# Patient Record
Sex: Female | Born: 1980 | Race: Black or African American | Hispanic: No | Marital: Single | Smoking: Current some day smoker
Health system: Southern US, Community
[De-identification: ages and names within clinical notes are randomized; demographics above are authoritative.]

## PROBLEM LIST (undated history)

## (undated) DIAGNOSIS — W3400XA Accidental discharge from unspecified firearms or gun, initial encounter: Secondary | ICD-10-CM

## (undated) DIAGNOSIS — R87619 Unspecified abnormal cytological findings in specimens from cervix uteri: Secondary | ICD-10-CM

## (undated) HISTORY — DX: Accidental discharge from unspecified firearms or gun, initial encounter: W34.00XA

## (undated) HISTORY — DX: Unspecified abnormal cytological findings in specimens from cervix uteri: R87.619

---

## 2000-06-14 DIAGNOSIS — A599 Trichomoniasis, unspecified: Secondary | ICD-10-CM

## 2000-06-14 HISTORY — DX: Trichomoniasis, unspecified: A59.9

## 2003-06-15 DIAGNOSIS — W3400XA Accidental discharge from unspecified firearms or gun, initial encounter: Secondary | ICD-10-CM

## 2003-06-15 HISTORY — DX: Accidental discharge from unspecified firearms or gun, initial encounter: W34.00XA

## 2003-06-15 HISTORY — PX: OTHER SURGICAL HISTORY: SHX169

## 2011-06-15 DIAGNOSIS — R87619 Unspecified abnormal cytological findings in specimens from cervix uteri: Secondary | ICD-10-CM

## 2011-06-15 HISTORY — DX: Unspecified abnormal cytological findings in specimens from cervix uteri: R87.619

## 2011-11-16 HISTORY — PX: CERVICAL BIOPSY  W/ LOOP ELECTRODE EXCISION: SUR135

## 2011-11-17 ENCOUNTER — Encounter: Payer: Self-pay | Admitting: Obstetrics and Gynecology

## 2017-07-28 ENCOUNTER — Emergency Department (HOSPITAL_COMMUNITY): Admission: EM | Admit: 2017-07-28 | Discharge: 2017-07-28 | Discharge status: Against Medical Advice | Payer: Self-pay

## 2017-07-28 NOTE — ED Triage Notes (Signed)
Pt gave her stickers to registration and left before triage.

## 2017-11-09 ENCOUNTER — Encounter: Payer: Self-pay | Admitting: General Practice

## 2017-11-17 ENCOUNTER — Encounter: Payer: Self-pay | Admitting: Obstetrics and Gynecology

## 2017-11-17 ENCOUNTER — Telehealth: Payer: Self-pay | Admitting: General Practice

## 2017-11-17 ENCOUNTER — Encounter: Payer: Self-pay | Admitting: General Practice

## 2017-11-17 NOTE — Telephone Encounter (Signed)
Charity application given to patient. 

## 2018-06-11 ENCOUNTER — Encounter (HOSPITAL_COMMUNITY): Payer: Self-pay | Admitting: *Deleted

## 2018-06-11 ENCOUNTER — Other Ambulatory Visit: Payer: Self-pay

## 2018-06-11 ENCOUNTER — Ambulatory Visit (HOSPITAL_COMMUNITY)
Admission: EM | Admit: 2018-06-11 | Discharge: 2018-06-11 | Disposition: A | Discharge status: Home / Self Care | Payer: Self-pay | Attending: Family Medicine | Admitting: Family Medicine

## 2018-06-11 DIAGNOSIS — J111 Influenza due to unidentified influenza virus with other respiratory manifestations: Secondary | ICD-10-CM | POA: Insufficient documentation

## 2018-06-11 MED ORDER — OSELTAMIVIR PHOSPHATE 75 MG PO CAPS: 75.0000 mg | ORAL_CAPSULE | Freq: Two times a day (BID) | ORAL | 0 refills | Status: DC

## 2018-06-11 MED ORDER — HYDROCODONE-HOMATROPINE 5-1.5 MG/5ML PO SYRP: 5.0000 mL | ORAL_SOLUTION | Freq: Four times a day (QID) | ORAL | 0 refills | Status: DC | PRN

## 2018-06-11 MED ORDER — BACITRACIN ZINC 500 UNIT/GM EX OINT
TOPICAL_OINTMENT | CUTANEOUS | Status: AC
  Filled 2018-06-11: qty 0.9

## 2018-06-11 NOTE — ED Provider Notes (Signed)
[facility]    CSN: [id number] Arrival date & time: 06/11/18  1502     History   Chief Complaint Chief Complaint  Patient presents with  . Generalized Body Aches  . Headache  . Cough    HPI Lindsey [name] is a 37 y.o. female.   This is a 37 year old woman comes in complaining of flulike symptoms: Myalgia, perceived fever, and cough.  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  Patient complains about headache and dry cough with occasional yellow phlegm.  She has myalgias and feels weak.  She has had no vomiting.  Patient works in a call center     History reviewed. No pertinent past medical history.  There are no active problems to display for this patient.   History reviewed. No pertinent surgical history.  OB History   No obstetric history on file.      Home Medications    Prior to Admission medications   Medication Sig Start Date End Date Taking? Authorizing Provider  HYDROcodone-homatropine (HYDROMET) 5-1.5 MG/5ML syrup Take 5 mLs by mouth every 6 (six) hours as needed for cough. 06/11/18   Elvina SidleLauenstein, Samanthamarie Ezzell, MD  oseltamivir (TAMIFLU) 75 MG capsule Take 1 capsule (75 mg total) by mouth every 12 (twelve) hours. 06/11/18   Elvina SidleLauenstein, Abdinasir Spadafore, MD    Family History History reviewed. No pertinent family history.  Social History Social History   Tobacco Use  . Smoking status: Current Every Day Smoker  . Smokeless tobacco: Never Used  Substance Use Topics  . Alcohol use: Not Currently  . Drug use: Not on file     Allergies   Patient has no known allergies.   Review of Systems Review of Systems   Physical Exam Triage Vital Signs ED Triage Vitals [06/11/18 1619]  Enc Vitals Group     BP 120/73     Pulse Rate 81     Resp 18     Temp 99 F (37.2 C)     Temp Source Oral     SpO2 100 %     Weight      Height      Head Circumference      Peak Flow      Pain Score 10     Pain Loc      Pain Edu?     Excl. in GC?    No data found.  Updated Vital Signs BP 120/73   Pulse 81   Temp 99 F (37.2 C) (Oral)   Resp 18   LMP 05/19/2018 (Approximate)   SpO2 100%    Physical Exam Vitals signs and nursing note reviewed.  Constitutional:      Appearance: She is well-developed.  HENT:     Head: Normocephalic.     Mouth/Throat:     Mouth: Mucous membranes are moist.  Eyes:     Extraocular Movements: Extraocular movements intact.  Neck:     Musculoskeletal: Normal range of motion and neck supple.  Cardiovascular:     Heart sounds: Normal heart sounds.  Pulmonary:     Effort: Pulmonary effort is normal.     Breath sounds: Normal breath sounds.  Musculoskeletal: Normal range of motion.  Skin:    General: Skin is warm and dry.  Neurological:     Mental Status: She is alert.     Motor: Weakness present.  Psychiatric:        Mood and Affect: Mood normal.  Speech: Speech normal.      UC Treatments / Results  Labs (all labs ordered are listed, but only abnormal results are displayed) Labs Reviewed - No data to display  EKG None  Radiology No results found.  Procedures Procedures (including critical care time)  Medications Ordered in UC Medications - No data to display  Initial Impression / Assessment and Plan / UC Course  I have reviewed the triage vital signs and the nursing notes.  Pertinent labs & imaging results that were available during my care of the patient were reviewed by me and considered in my medical decision making (see chart for details).    Final Clinical Impressions(s) / UC Diagnoses   Final diagnoses:  Influenza   Discharge Instructions   None    ED Prescriptions    Medication Sig Dispense Auth. Provider   oseltamivir (TAMIFLU) 75 MG capsule Take 1 capsule (75 mg total) by mouth every 12 (twelve) hours. 10 capsule Elvina SidleLauenstein, Ikaika Showers, MD   HYDROcodone-homatropine (HYDROMET) 5-1.5 MG/5ML syrup Take 5 mLs by mouth every 6 (six) hours as  needed for cough. 60 mL Elvina SidleLauenstein, Jaylina Ramdass, MD     Controlled Substance Prescriptions [place] Controlled Substance Registry consulted? Not Applicable   Elvina SidleLauenstein, Reghan Thul, MD 06/11/18 [phone]

## 2018-06-11 NOTE — ED Triage Notes (Signed)
C/O body aches, tactile fever, right-sided HA x couple days.

## 2018-06-21 ENCOUNTER — Encounter: Payer: Self-pay | Admitting: Obstetrics and Gynecology

## 2018-07-07 ENCOUNTER — Encounter: Payer: Self-pay | Admitting: Obstetrics and Gynecology

## 2018-07-07 ENCOUNTER — Ambulatory Visit (INDEPENDENT_AMBULATORY_CARE_PROVIDER_SITE_OTHER): Payer: 59 | Admitting: Obstetrics and Gynecology

## 2018-07-07 ENCOUNTER — Other Ambulatory Visit: Payer: Self-pay

## 2018-07-07 ENCOUNTER — Other Ambulatory Visit (HOSPITAL_COMMUNITY)
Admission: RE | Admit: 2018-07-07 | Discharge: 2018-07-07 | Disposition: A | Discharge status: Home / Self Care | Payer: Self-pay | Source: Ambulatory Visit | Attending: Obstetrics and Gynecology | Admitting: Obstetrics and Gynecology

## 2018-07-07 VITALS — BP 110/70 | HR 84 | Resp 18 | Ht 67.0 in | Wt 182.0 lb

## 2018-07-07 DIAGNOSIS — Z01419 Encounter for gynecological examination (general) (routine) without abnormal findings: Secondary | ICD-10-CM | POA: Insufficient documentation

## 2018-07-07 NOTE — Patient Instructions (Signed)
EXERCISE AND DIET:  We recommended that you start or continue a regular exercise program for good health. Regular exercise means any activity that makes your heart beat faster and makes you sweat.  We recommend exercising at least 30 minutes per day at least 3 days a week, preferably 4 or 5.  We also recommend a diet low in fat and sugar.  Inactivity, poor dietary choices and obesity can cause diabetes, heart attack, stroke, and kidney damage, among others.    ALCOHOL AND SMOKING:  Women should limit their alcohol intake to no more than 7 drinks/beers/glasses of wine (combined, not each!) per week. Moderation of alcohol intake to this level decreases your risk of breast cancer and liver damage. And of course, no recreational drugs are part of a healthy lifestyle.  And absolutely no smoking or even second hand smoke. Most people know smoking can cause heart and lung diseases, but did you know it also contributes to weakening of your bones? Aging of your skin?  Yellowing of your teeth and nails?  CALCIUM AND VITAMIN D:  Adequate intake of calcium and Vitamin D are recommended.  The recommendations for exact amounts of these supplements seem to change often, but generally speaking 600 mg of calcium (either carbonate or citrate) and 800 units of Vitamin D per day seems prudent. Certain women may benefit from higher intake of Vitamin D.  If you are among these women, your doctor will have told you during your visit.    PAP SMEARS:  Pap smears, to check for cervical cancer or precancers,  have traditionally been done yearly, although recent scientific advances have shown that most women can have pap smears less often.  However, every woman still should have a physical exam from her gynecologist every year. It will include a breast check, inspection of the vulva and vagina to check for abnormal growths or skin changes, a visual exam of the cervix, and then an exam to evaluate the size and shape of the uterus and  ovaries.  And after 40 years of age, a rectal exam is indicated to check for rectal cancers. We will also provide age appropriate advice regarding health maintenance, like when you should have certain vaccines, screening for sexually transmitted diseases, bone density testing, colonoscopy, mammograms, etc.   MAMMOGRAMS:  All women over 40 years old should have a yearly mammogram. Many facilities now offer a "3D" mammogram, which may cost around $50 extra out of pocket. If possible,  we recommend you accept the option to have the 3D mammogram performed.  It both reduces the number of women who will be called back for extra views which then turn out to be normal, and it is better than the routine mammogram at detecting truly abnormal areas.    COLONOSCOPY:  Colonoscopy to screen for colon cancer is recommended for all women at age 50.  We know, you hate the idea of the prep.  We agree, BUT, having colon cancer and not knowing it is worse!!  Colon cancer so often starts as a polyp that can be seen and removed at colonscopy, which can quite literally save your life!  And if your first colonoscopy is normal and you have no family history of colon cancer, most women don't have to have it again for 10 years.  Once every ten years, you can do something that may end up saving your life, right?  We will be happy to help you get it scheduled when you are ready.    Be sure to check your insurance coverage so you understand how much it will cost.  It may be covered as a preventative service at no cost, but you should check your particular policy.     HPV (Human Papillomavirus) Vaccine: What You Need to Know 1. Why get vaccinated? HPV vaccine prevents infection with human papillomavirus (HPV) types that are associated with many cancers, including:  cervical cancer in females,  vaginal and vulvar cancers in females,  anal cancer in females and males,  throat cancer in females and males, and  penile cancer in  males. In addition, HPV vaccine prevents infection with HPV types that cause genital warts in both females and males. In the [place] about 12,000 women get cervical cancer every year, and about 4,000 women die from it. HPV vaccine can prevent most of these cases of cervical cancer. Vaccination is not a substitute for cervical cancer screening. This vaccine does not protect against all HPV types that can cause cervical cancer. Women should still get regular Pap tests. HPV infection usually comes from sexual contact, and most people will become infected at some point in their life. About 14 million Americans, including teens, get infected every year. Most infections will go away on their own and not cause serious problems. But thousands of women and men get cancer and other diseases from HPV. 2. HPV vaccine HPV vaccine is approved by FDA and is recommended by CDC for both males and females. It is routinely given at 11 or 39 years of age, but it may be given beginning at age 9 years through age 26 years. Most adolescents 9 through 39 years of age should get HPV vaccine as a two-dose series with the doses separated by 6-12 months. People who start HPV vaccination at 15 years of age and older should get the vaccine as a three-dose series with the second dose given 1-2 months after the first dose and the third dose given 6 months after the first dose. There are several exceptions to these age recommendations. Your health care provider can give you more information. 3. Some people should not get this vaccine  Anyone who has had a severe (life-threatening) allergic reaction to a dose of HPV vaccine should not get another dose.  Anyone who has a severe (life threatening) allergy to any component of HPV vaccine should not get the vaccine. ? Tell your doctor if you have any severe allergies that you know of, including a severe allergy to yeast.  HPV vaccine is not recommended for pregnant women. If you learn that  you were pregnant when you were vaccinated, there is no reason to expect any problems for you or your baby. Any woman who learns she was pregnant when she got HPV vaccine is encouraged to contact the manufacturer's registry for HPV vaccination during pregnancy at 1-800-986-8999. Women who are breastfeeding may be vaccinated.  If you have a mild illness, such as a cold, you can probably get the vaccine today. If you are moderately or severely ill, you should probably wait until you recover. Your doctor can advise you. 4. Risks of a vaccine reaction With any medicine, including vaccines, there is a chance of side effects. These are usually mild and go away on their own, but serious reactions are also possible. Most people who get HPV vaccine do not have any serious problems with it. Mild or moderate problems following HPV vaccine:  Reactions in the arm where the shot was given: ? Soreness (about 9   people in 10) ? Redness or swelling (about 1 person in 3)  Fever: ? Mild (100F) (about 1 person in 10) ? Moderate (102F) (about 1 person in 65)  Other problems: ? Headache (about 1 person in 3) Problems that could happen after any injected vaccine:  People sometimes faint after a medical procedure, including vaccination. Sitting or lying down for about 15 minutes can help prevent fainting, and injuries caused by a fall. Tell your doctor if you feel dizzy, or have vision changes or ringing in the ears.  Some people get severe pain in the shoulder and have difficulty moving the arm where a shot was given. This happens very rarely.  Any medication can cause a severe allergic reaction. Such reactions from a vaccine are very rare, estimated at about 1 in a million doses, and would happen within a few minutes to a few hours after the vaccination. As with any medicine, there is a very remote chance of a vaccine causing a serious injury or death. The safety of vaccines is always being monitored. For more  information, visit: www.cdc.gov/vaccinesafety/. 5. What if there is a serious reaction? What should I look for? Look for anything that concerns you, such as signs of a severe allergic reaction, very high fever, or unusual behavior. Signs of a severe allergic reaction can include hives, swelling of the face and throat, difficulty breathing, a fast heartbeat, dizziness, and weakness. These would usually start a few minutes to a few hours after the vaccination. What should I do? If you think it is a severe allergic reaction or other emergency that can't wait, call 9-1-1 or get to the nearest hospital. Otherwise, call your doctor. Afterward, the reaction should be reported to the Vaccine Adverse Event Reporting System (VAERS). Your doctor should file this report, or you can do it yourself through the VAERS web site at www.vaers.hhs.gov, or by calling 1-800-822-7967. VAERS does not give medical advice. 6. The National Vaccine Injury Compensation Program The National Vaccine Injury Compensation Program (VICP) is a federal program that was created to compensate people who may have been injured by certain vaccines. Persons who believe they may have been injured by a vaccine can learn about the program and about filing a claim by calling 1-800-338-2382 or visiting the VICP website at www.hrsa.gov/vaccinecompensation. There is a time limit to file a claim for compensation. 7. How can I learn more?  Ask your health care provider. He or she can give you the vaccine package insert or suggest other sources of information.  Call your local or state health department.  Contact the Centers for Disease Control and Prevention (CDC): ? Call 1-800-232-4636 (1-800-CDC-INFO) or ? Visit CDC's website at www.cdc.gov/hpv Vaccine Information Statement HPV Vaccine (05/16/2015) This information is not intended to replace advice given to you by your health care provider. Make sure you discuss any questions you have with  your health care provider. Document Released: 12/26/2013 Document Revised: 01/10/2018 Document Reviewed: 01/10/2018 Elsevier Interactive Patient Education  2019 Elsevier Inc.  

## 2018-07-07 NOTE — Progress Notes (Signed)
PCP referral made for Dr. Orland Mustard. Appointment scheduled. Pt wishes to plan to 07/24/2018 so she may request time off of work.  New patient paperwork provided.
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
## 2018-07-09 LAB — CBC
HEMATOCRIT: 35.9 % (ref 34.0–46.6)
HEMOGLOBIN: 11.8 g/dL (ref 11.1–15.9)
MCH: 30.6 pg (ref 26.6–33.0)
MCHC: 32.9 g/dL (ref 31.5–35.7)
MCV: 93 fL (ref 79–97)
Platelets: 281 10*3/uL (ref 150–450)
RBC: 3.85 x10E6/uL (ref 3.77–5.28)
RDW: 12.6 % (ref 11.7–15.4)
WBC: 5.1 10*3/uL (ref 3.4–10.8)

## 2018-07-09 LAB — COMPREHENSIVE METABOLIC PANEL
A/G RATIO: 1.4 (ref 1.2–2.2)
ALT: 14 IU/L (ref 0–32)
AST: 12 IU/L (ref 0–40)
Albumin: 4.3 g/dL (ref 3.8–4.8)
Alkaline Phosphatase: 58 IU/L (ref 39–117)
BUN / CREAT RATIO: 11 (ref 9–23)
BUN: 9 mg/dL (ref 6–20)
Bilirubin Total: 0.4 mg/dL (ref 0.0–1.2)
CO2: 19 mmol/L — ABNORMAL LOW (ref 20–29)
Calcium: 9.6 mg/dL (ref 8.7–10.2)
Chloride: 103 mmol/L (ref 96–106)
Creatinine, Ser: 0.79 mg/dL (ref 0.57–1.00)
GFR calc Af Amer: 110 mL/min/{1.73_m2} (ref >59)
GFR calc non Af Amer: 95 mL/min/{1.73_m2} (ref >59)
Globulin, Total: 3.1 g/dL (ref 1.5–4.5)
Glucose: 85 mg/dL (ref 65–99)
Potassium: 4.2 mmol/L (ref 3.5–5.2)
Sodium: 137 mmol/L (ref 134–144)
Total Protein: 7.4 g/dL (ref 6.0–8.5)

## 2018-07-09 LAB — LIPID PANEL
Chol/HDL Ratio: 3.9 ratio (ref 0.0–4.4)
Cholesterol, Total: 182 mg/dL (ref 100–199)
HDL: 47 mg/dL (ref >39)
LDL Calculated: 111 mg/dL — ABNORMAL HIGH (ref 0–99)
Triglycerides: 118 mg/dL (ref 0–149)
VLDL Cholesterol Cal: 24 mg/dL (ref 5–40)

## 2018-07-09 LAB — TSH: TSH: 1.17 u[IU]/mL (ref 0.450–4.500)

## 2018-07-09 LAB — HEP, RPR, HIV PANEL
HIV Screen 4th Generation wRfx: NONREACTIVE
Hepatitis B Surface Ag: NEGATIVE
RPR Ser Ql: NONREACTIVE

## 2018-07-09 LAB — HEPATITIS C ANTIBODY: Hep C Virus Ab: <0.1 s/co ratio (ref 0.0–0.9)

## 2018-07-11 LAB — CYTOLOGY - PAP
Chlamydia: NEGATIVE
Diagnosis: NEGATIVE
HPV: NOT DETECTED
Neisseria Gonorrhea: NEGATIVE
Trichomonas: NEGATIVE

## 2018-07-12 ENCOUNTER — Telehealth: Payer: Self-pay | Admitting: Obstetrics and Gynecology

## 2018-07-12 NOTE — Telephone Encounter (Signed)
Patient is asking for her recent lab results. She states she was told they should be in by now.

## 2018-07-12 NOTE — Telephone Encounter (Signed)
Results sent through My Chart just now.

## 2018-07-12 NOTE — Telephone Encounter (Signed)
Routing to Dr. Edward Jolly to review results dated 07/07/18

## 2018-07-12 NOTE — Telephone Encounter (Signed)
Results viewed by Melvyn Neth on 07/12/2018 3:01 PM    Encounter closed.

## 2018-07-24 ENCOUNTER — Ambulatory Visit: Payer: Self-pay | Admitting: Family Medicine

## 2018-07-27 ENCOUNTER — Ambulatory Visit: Payer: Self-pay | Admitting: Family Medicine

## 2018-08-07 ENCOUNTER — Ambulatory Visit: Payer: Self-pay | Admitting: Family Medicine

## 2018-08-10 ENCOUNTER — Encounter: Payer: Self-pay | Admitting: Family Medicine

## 2018-08-15 ENCOUNTER — Telehealth: Payer: Self-pay | Admitting: Obstetrics and Gynecology

## 2018-08-15 NOTE — Telephone Encounter (Signed)
LEEP pathology report received and to Dr. Edward Jolly for review.

## 2018-08-15 NOTE — Telephone Encounter (Signed)
Call placed to [facility], spoke with Angie in Medical records. Will fax copy of LEEP pathology report to [facility].

## 2018-08-15 NOTE — Telephone Encounter (Signed)
Please facilitate in getting copy of pathology report from LEEP procedure.   I received her operative report from 11/16/11 from [facility] in [place], [place].  No pathology report accompanied this.

## 2018-08-17 ENCOUNTER — Encounter: Payer: Self-pay | Admitting: Obstetrics and Gynecology

## 2018-08-17 NOTE — Telephone Encounter (Signed)
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  Her ECC was benign.   I have update her history in Epic.   Her pap this year was normal and she had a negative HR HPV.   You may close the encounter.

## 2018-11-30 ENCOUNTER — Other Ambulatory Visit: Payer: Self-pay

## 2018-11-30 ENCOUNTER — Inpatient Hospital Stay (HOSPITAL_COMMUNITY)
Admission: EM | Admit: 2018-11-30 | Discharge: 2018-11-30 | Disposition: A | Discharge status: Home / Self Care | Payer: 59 | Attending: Obstetrics and Gynecology | Admitting: Obstetrics and Gynecology

## 2018-11-30 ENCOUNTER — Inpatient Hospital Stay (HOSPITAL_COMMUNITY): Payer: 59

## 2018-11-30 ENCOUNTER — Encounter (HOSPITAL_COMMUNITY): Payer: Self-pay | Admitting: *Deleted

## 2018-11-30 DIAGNOSIS — F17210 Nicotine dependence, cigarettes, uncomplicated: Secondary | ICD-10-CM | POA: Diagnosis not present

## 2018-11-30 DIAGNOSIS — Z3A08 8 weeks gestation of pregnancy: Secondary | ICD-10-CM | POA: Diagnosis not present

## 2018-11-30 DIAGNOSIS — O09521 Supervision of elderly multigravida, first trimester: Secondary | ICD-10-CM | POA: Diagnosis not present

## 2018-11-30 DIAGNOSIS — R1032 Left lower quadrant pain: Secondary | ICD-10-CM | POA: Diagnosis not present

## 2018-11-30 DIAGNOSIS — R109 Unspecified abdominal pain: Secondary | ICD-10-CM

## 2018-11-30 DIAGNOSIS — Z3A01 Less than 8 weeks gestation of pregnancy: Secondary | ICD-10-CM

## 2018-11-30 DIAGNOSIS — Z825 Family history of asthma and other chronic lower respiratory diseases: Secondary | ICD-10-CM | POA: Insufficient documentation

## 2018-11-30 DIAGNOSIS — R1031 Right lower quadrant pain: Secondary | ICD-10-CM | POA: Diagnosis not present

## 2018-11-30 DIAGNOSIS — Z87828 Personal history of other (healed) physical injury and trauma: Secondary | ICD-10-CM

## 2018-11-30 DIAGNOSIS — O99331 Smoking (tobacco) complicating pregnancy, first trimester: Secondary | ICD-10-CM | POA: Insufficient documentation

## 2018-11-30 DIAGNOSIS — Z9889 Other specified postprocedural states: Secondary | ICD-10-CM

## 2018-11-30 DIAGNOSIS — O26891 Other specified pregnancy related conditions, first trimester: Secondary | ICD-10-CM | POA: Diagnosis not present

## 2018-11-30 DIAGNOSIS — O0990 Supervision of high risk pregnancy, unspecified, unspecified trimester: Secondary | ICD-10-CM

## 2018-11-30 LAB — CBC WITH DIFFERENTIAL/PLATELET
Abs Immature Granulocytes: 0.02 10*3/uL (ref 0.00–0.07)
Basophils Absolute: 0 10*3/uL (ref 0.0–0.1)
Basophils Relative: 0 %
Eosinophils Absolute: 0.1 10*3/uL (ref 0.0–0.5)
Eosinophils Relative: 1 %
HCT: 31.9 % — ABNORMAL LOW (ref 36.0–46.0)
Hemoglobin: 10.8 g/dL — ABNORMAL LOW (ref 12.0–15.0)
Immature Granulocytes: 0 %
Lymphocytes Relative: 40 %
Lymphs Abs: 2.7 10*3/uL (ref 0.7–4.0)
MCH: 30.9 pg (ref 26.0–34.0)
MCHC: 33.9 g/dL (ref 30.0–36.0)
MCV: 91.4 fL (ref 80.0–100.0)
Monocytes Absolute: 0.7 10*3/uL (ref 0.1–1.0)
Monocytes Relative: 10 %
Neutro Abs: 3.4 10*3/uL (ref 1.7–7.7)
Neutrophils Relative %: 49 %
Platelets: 298 10*3/uL (ref 150–400)
RBC: 3.49 MIL/uL — ABNORMAL LOW (ref 3.87–5.11)
RDW: 12.5 % (ref 11.5–15.5)
WBC: 6.9 10*3/uL (ref 4.0–10.5)
nRBC: 0 % (ref 0.0–0.2)

## 2018-11-30 LAB — ABO/RH: ABO/RH(D): A POS

## 2018-11-30 LAB — URINALYSIS, ROUTINE W REFLEX MICROSCOPIC
Bilirubin Urine: NEGATIVE
Glucose, UA: NEGATIVE mg/dL
Hgb urine dipstick: NEGATIVE
Ketones, ur: NEGATIVE mg/dL
Leukocytes,Ua: NEGATIVE
Nitrite: NEGATIVE
Protein, ur: NEGATIVE mg/dL
Specific Gravity, Urine: 1.026 (ref 1.005–1.030)
pH: 7 (ref 5.0–8.0)

## 2018-11-30 LAB — HCG, QUANTITATIVE, PREGNANCY: hCG, Beta Chain, Quant, S: 190749 m[IU]/mL — ABNORMAL HIGH (ref <5)

## 2018-11-30 LAB — POCT PREGNANCY, URINE: Preg Test, Ur: POSITIVE — AB

## 2018-11-30 MED ORDER — ACETAMINOPHEN 325 MG PO TABS
650.0000 mg | ORAL_TABLET | Freq: Once | ORAL | Status: AC
  Administered 2018-11-30: 650 mg via ORAL
  Filled 2018-11-30: qty 2

## 2018-11-30 NOTE — Discharge Instructions (Signed)
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
## 2018-11-30 NOTE — MAU Note (Signed)
Pt reports she has had bad stomach cramps for 2 days. Had a positive HPT on Friday. Denies any vag bleeding with normal vag discharge.

## 2019-01-02 ENCOUNTER — Other Ambulatory Visit: Payer: Self-pay

## 2019-01-04 ENCOUNTER — Encounter: Payer: Self-pay | Admitting: Obstetrics and Gynecology

## 2019-01-04 ENCOUNTER — Ambulatory Visit: Payer: 59 | Admitting: Obstetrics and Gynecology

## 2019-01-22 ENCOUNTER — Telehealth: Payer: Self-pay | Admitting: Obstetrics and Gynecology

## 2019-01-22 NOTE — Telephone Encounter (Signed)
Patient want to come in to discuss birth control options. Not available for appointment tomorrow.

## 2019-01-22 NOTE — Telephone Encounter (Signed)
Left message to call Sharee Pimple, RN at [place].    Per review of Epic, patient seen at [facility] MAU 11/30/18, [redacted]wks gestation.

## 2019-01-22 NOTE — Telephone Encounter (Signed)
Spoke with patient. Patient requesting OV to discuss contraceptive options, interested in depo provera. Patient states she was seen at [facility] clinic for TOP after 11/30/18 MAU visit, seen for f/u on 12/26/18. Patient has records, will bring with her to [place]. LMP 01/12/19 -01/15/19. OV scheduled for 8/26 at 4:30pm with Dr. Quincy Simmonds. Patient declines earlier OV. Last AEX 07/07/18.   Routing to provider for final review. Patient is agreeable to disposition. Will close encounter.

## 2019-02-07 ENCOUNTER — Other Ambulatory Visit: Payer: Self-pay

## 2019-02-07 ENCOUNTER — Ambulatory Visit (INDEPENDENT_AMBULATORY_CARE_PROVIDER_SITE_OTHER): Payer: 59 | Admitting: Obstetrics and Gynecology

## 2019-02-07 ENCOUNTER — Encounter: Payer: Self-pay | Admitting: Obstetrics and Gynecology

## 2019-02-07 VITALS — BP 120/70 | HR 88 | Temp 98.1°F | Ht 67.0 in | Wt 190.6 lb

## 2019-02-07 DIAGNOSIS — Z3009 Encounter for other general counseling and advice on contraception: Secondary | ICD-10-CM | POA: Diagnosis not present
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
## 2019-02-13 ENCOUNTER — Other Ambulatory Visit: Payer: Self-pay

## 2019-02-13 ENCOUNTER — Ambulatory Visit (INDEPENDENT_AMBULATORY_CARE_PROVIDER_SITE_OTHER): Payer: 59

## 2019-02-13 VITALS — BP 116/60 | HR 80 | Temp 97.7°F | Resp 14 | Ht 69.0 in | Wt 184.0 lb

## 2019-02-13 DIAGNOSIS — Z3042 Encounter for surveillance of injectable contraceptive: Secondary | ICD-10-CM | POA: Diagnosis not present

## 2019-02-13 MED ORDER — MEDROXYPROGESTERONE ACETATE 150 MG/ML IM SUSP
150.0000 mg | Freq: Once | INTRAMUSCULAR | Status: AC
  Administered 2019-02-13: 150 mg via INTRAMUSCULAR

## 2019-02-13 NOTE — Progress Notes (Signed)
Patient is here for Depo Provera Injection Patient is within Depo Provera Calender Limits 1st injection -- okay to administer per Dr. Elza Rafter note 02/07/19 Next Depo Due between: 11/17-12/1 Last AEX: 07/07/18 BS AEX Scheduled: 07/09/19  Patient is aware when next depo is due  Pt tolerated Injection well in [place].  Routed to provider for review, encounter closed.

## 2019-05-01 ENCOUNTER — Other Ambulatory Visit: Payer: Self-pay

## 2019-05-01 ENCOUNTER — Ambulatory Visit (INDEPENDENT_AMBULATORY_CARE_PROVIDER_SITE_OTHER): Payer: 59

## 2019-05-01 ENCOUNTER — Ambulatory Visit: Payer: Self-pay

## 2019-05-01 VITALS — BP 118/60 | HR 76 | Temp 97.2°F | Resp 12 | Ht 69.0 in | Wt 191.0 lb

## 2019-05-01 DIAGNOSIS — Z3042 Encounter for surveillance of injectable contraceptive: Secondary | ICD-10-CM | POA: Diagnosis not present

## 2019-05-01 MED ORDER — MEDROXYPROGESTERONE ACETATE 150 MG/ML IM SUSP
150.0000 mg | Freq: Once | INTRAMUSCULAR | Status: AC
  Administered 2019-05-01: 150 mg via INTRAMUSCULAR

## 2019-05-01 NOTE — Progress Notes (Signed)
Patient is here for Depo Provera Injection Patient is within Depo Provera Calender Limits 11/17-12/1 Next Depo Due between: 2/2-2/16 Last AEX: 07/07/18 BS AEX Scheduled: 07/09/19  Patient is aware when next depo is due  Pt tolerated Injection well in [place].  Routed to provider for review, encounter closed.

## 2019-05-01 NOTE — Progress Notes (Deleted)
Patient here for Depo Provera Injection.  Patient is within calender limits. 11 weeks since last Depo on 02/13/2019 Ok to administer Depo per Dr Elza Rafter note on 02/07/2019.   Next Depo due between Feb 2- Jul 31, 2019 Last AEX : 02/07/19 AEX scheduled 07/09/2019  Pt is aware when next depo is due.  Pt tolerated injection well in ***  Routed to provider for review, encounter closed.

## 2019-06-15 DIAGNOSIS — R7989 Other specified abnormal findings of blood chemistry: Secondary | ICD-10-CM

## 2019-06-15 HISTORY — DX: Other specified abnormal findings of blood chemistry: R79.89

## 2019-07-09 ENCOUNTER — Ambulatory Visit: Payer: 59 | Admitting: Obstetrics and Gynecology

## 2019-07-13 ENCOUNTER — Other Ambulatory Visit: Payer: Self-pay

## 2019-07-17 ENCOUNTER — Ambulatory Visit: Payer: 59 | Admitting: Obstetrics and Gynecology

## 2019-07-17 ENCOUNTER — Ambulatory Visit: Payer: 59

## 2019-07-24 ENCOUNTER — Other Ambulatory Visit: Payer: Self-pay

## 2019-07-25 ENCOUNTER — Encounter: Payer: Self-pay | Admitting: Obstetrics and Gynecology

## 2019-07-25 ENCOUNTER — Other Ambulatory Visit (HOSPITAL_COMMUNITY)
Admission: RE | Admit: 2019-07-25 | Discharge: 2019-07-25 | Disposition: A | Discharge status: Home / Self Care | Payer: 59 | Source: Ambulatory Visit | Attending: Obstetrics and Gynecology | Admitting: Obstetrics and Gynecology

## 2019-07-25 ENCOUNTER — Ambulatory Visit (INDEPENDENT_AMBULATORY_CARE_PROVIDER_SITE_OTHER): Payer: 59 | Admitting: Obstetrics and Gynecology

## 2019-07-25 VITALS — BP 120/76 | HR 80 | Temp 97.1°F | Resp 18 | Ht 67.0 in | Wt 190.0 lb

## 2019-07-25 DIAGNOSIS — Z01419 Encounter for gynecological examination (general) (routine) without abnormal findings: Secondary | ICD-10-CM | POA: Insufficient documentation

## 2019-07-25 DIAGNOSIS — Z113 Encounter for screening for infections with a predominantly sexual mode of transmission: Secondary | ICD-10-CM | POA: Diagnosis present

## 2019-07-25 DIAGNOSIS — R7989 Other specified abnormal findings of blood chemistry: Secondary | ICD-10-CM | POA: Diagnosis not present

## 2019-07-25 DIAGNOSIS — Z3042 Encounter for surveillance of injectable contraceptive: Secondary | ICD-10-CM | POA: Diagnosis not present

## 2019-07-25 MED ORDER — MEDROXYPROGESTERONE ACETATE 150 MG/ML IM SUSP
150.0000 mg | Freq: Once | INTRAMUSCULAR | Status: AC
  Administered 2019-07-25: 16:00:00 150 mg via INTRAMUSCULAR
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
## 2019-07-25 NOTE — Patient Instructions (Signed)

## 2019-07-26 ENCOUNTER — Ambulatory Visit (INDEPENDENT_AMBULATORY_CARE_PROVIDER_SITE_OTHER)
Admission: RE | Admit: 2019-07-26 | Discharge: 2019-07-26 | Disposition: A | Discharge status: Home / Self Care | Payer: 59 | Source: Ambulatory Visit

## 2019-07-26 DIAGNOSIS — R21 Rash and other nonspecific skin eruption: Secondary | ICD-10-CM | POA: Diagnosis not present

## 2019-07-26 LAB — CBC
Hematocrit: 36.6 % (ref 34.0–46.6)
Hemoglobin: 11.9 g/dL (ref 11.1–15.9)
MCH: 31.2 pg (ref 26.6–33.0)
MCHC: 32.5 g/dL (ref 31.5–35.7)
MCV: 96 fL (ref 79–97)
Platelets: 320 10*3/uL (ref 150–450)
RBC: 3.82 x10E6/uL (ref 3.77–5.28)
RDW: 13 % (ref 11.7–15.4)
WBC: 5.9 10*3/uL (ref 3.4–10.8)

## 2019-07-26 LAB — COMPREHENSIVE METABOLIC PANEL
ALT: 13 IU/L (ref 0–32)
AST: 14 IU/L (ref 0–40)
Albumin/Globulin Ratio: 1.4 (ref 1.2–2.2)
Albumin: 4.3 g/dL (ref 3.8–4.8)
Alkaline Phosphatase: 66 IU/L (ref 39–117)
BUN/Creatinine Ratio: 16 (ref 9–23)
BUN: 12 mg/dL (ref 6–20)
Bilirubin Total: <0.2 mg/dL (ref 0.0–1.2)
CO2: 22 mmol/L (ref 20–29)
Calcium: 9.7 mg/dL (ref 8.7–10.2)
Chloride: 106 mmol/L (ref 96–106)
Creatinine, Ser: 0.76 mg/dL (ref 0.57–1.00)
GFR calc Af Amer: 114 mL/min/{1.73_m2} (ref >59)
GFR calc non Af Amer: 99 mL/min/{1.73_m2} (ref >59)
Globulin, Total: 3 g/dL (ref 1.5–4.5)
Glucose: 91 mg/dL (ref 65–99)
Potassium: 4.3 mmol/L (ref 3.5–5.2)
Sodium: 138 mmol/L (ref 134–144)
Total Protein: 7.3 g/dL (ref 6.0–8.5)

## 2019-07-26 LAB — HEP, RPR, HIV PANEL
HIV Screen 4th Generation wRfx: NONREACTIVE
Hepatitis B Surface Ag: NEGATIVE
RPR Ser Ql: NONREACTIVE

## 2019-07-26 LAB — VITAMIN D 25 HYDROXY (VIT D DEFICIENCY, FRACTURES): Vit D, 25-Hydroxy: 8.5 ng/mL — ABNORMAL LOW (ref 30.0–100.0)

## 2019-07-26 LAB — TSH: TSH: 0.66 u[IU]/mL (ref 0.450–4.500)

## 2019-07-26 LAB — LIPID PANEL
Chol/HDL Ratio: 3.7 ratio (ref 0.0–4.4)
Cholesterol, Total: 176 mg/dL (ref 100–199)
HDL: 47 mg/dL (ref >39)
LDL Chol Calc (NIH): 98 mg/dL (ref 0–99)
Triglycerides: 179 mg/dL — ABNORMAL HIGH (ref 0–149)
VLDL Cholesterol Cal: 31 mg/dL (ref 5–40)

## 2019-07-26 LAB — HEPATITIS C ANTIBODY: Hep C Virus Ab: <0.1 s/co ratio (ref 0.0–0.9)

## 2019-07-26 MED ORDER — TRIAMCINOLONE ACETONIDE 0.1 % EX CREA: 1.0000 "application " | TOPICAL_CREAM | Freq: Two times a day (BID) | CUTANEOUS | 0 refills | Status: DC

## 2019-07-26 MED ORDER — MUPIROCIN CALCIUM 2 % EX CREA: 1.0000 "application " | TOPICAL_CREAM | Freq: Two times a day (BID) | CUTANEOUS | 0 refills | Status: DC

## 2019-07-26 NOTE — Discharge Instructions (Addendum)
Use the mupirocin cream twice a day x 3 days.  Then start the triamcinolone cream twice a day x 7 days.    Come here to be seen in person if your symptoms persist or worsen.  Come in right away if your have redness, pus-like drainage, red streaks, fever, or other signs of infection.
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
## 2019-07-27 ENCOUNTER — Encounter: Payer: Self-pay | Admitting: Obstetrics and Gynecology

## 2019-07-27 ENCOUNTER — Other Ambulatory Visit: Payer: Self-pay

## 2019-07-27 DIAGNOSIS — R7989 Other specified abnormal findings of blood chemistry: Secondary | ICD-10-CM

## 2019-07-27 LAB — CYTOLOGY - PAP
Chlamydia: NEGATIVE
Comment: NEGATIVE
Comment: NEGATIVE
Comment: NEGATIVE
Comment: NORMAL
Diagnosis: NEGATIVE
High risk HPV: NEGATIVE
Neisseria Gonorrhea: NEGATIVE
Trichomonas: NEGATIVE

## 2019-07-27 MED ORDER — VITAMIN D (ERGOCALCIFEROL) 1.25 MG (50000 UNIT) PO CAPS: 50000.0000 [IU] | ORAL_CAPSULE | ORAL | 0 refills | Status: DC

## 2019-07-27 NOTE — Progress Notes (Signed)
Vit D 50000 IU Rx sent to pharmacy on file. # 12, 0RF.

## 2019-07-31 ENCOUNTER — Telehealth: Payer: Self-pay | Admitting: Obstetrics and Gynecology

## 2019-07-31 DIAGNOSIS — B3731 Acute candidiasis of vulva and vagina: Secondary | ICD-10-CM

## 2019-07-31 DIAGNOSIS — B373 Candidiasis of vulva and vagina: Secondary | ICD-10-CM

## 2019-07-31 NOTE — Telephone Encounter (Signed)
Patient says she think she has a yeast infection. She can't come in tomorrow because son has appointment the same time as available appointment with Dr Edward Jolly. Wondering if something can be called in to the pharmacy on file.

## 2019-07-31 NOTE — Telephone Encounter (Signed)
Spoke to pt. Pt states having itching, white milky discharge since being seen on 07/25/2019 for AEX. Pt had sx for 5 days now. Pt states did try Monistat 3 days and nothing changed sx. Pt wondering if from gel at AEX. Marland Kitchen Pt denies SA since AEX. Pt declines appt 2/17 with Dr Edward Jolly because has to take son for appt at 3:45 pm and only appt available is 3:30pm with Dr Edward Jolly. Wants to know if can have Diflucan Rx for sx?   Routing to Dr Edward Jolly for review and recommendations. Rx pended if approved #2,0RF.

## 2019-08-01 NOTE — Telephone Encounter (Signed)
Pt returned call. Pt given recommendations per Dr Edward Jolly. Pt agreeable to OV. Pt declines OV today due to son's appt at 3:45 pm and declines appt 2/18 d/t potential weather. Pt scheduled for OV on 08/06/2019 at 9 am with Dr Edward Jolly. Pt verbalized understanding.   Routing to Dr Edward Jolly for review and will close encounter.

## 2019-08-01 NOTE — Telephone Encounter (Signed)
I recommend an office visit with me.  Her pap did not show any signs of yeast or bacterial vaginosis.  A pap is not a test for this, but sometimes the pathologist notes these changes with the evaluation.

## 2019-08-01 NOTE — Telephone Encounter (Signed)
Left message for pt to return call to office and speak to [place], [place]

## 2019-08-06 ENCOUNTER — Encounter: Payer: Self-pay | Admitting: Obstetrics and Gynecology

## 2019-08-06 ENCOUNTER — Ambulatory Visit: Payer: Self-pay | Admitting: Obstetrics and Gynecology
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
## 2019-10-15 ENCOUNTER — Ambulatory Visit: Payer: Self-pay

## 2019-10-15 ENCOUNTER — Other Ambulatory Visit: Payer: Self-pay

## 2019-10-16 ENCOUNTER — Ambulatory Visit: Payer: Self-pay

## 2019-10-16 ENCOUNTER — Other Ambulatory Visit: Payer: Self-pay

## 2019-10-16 NOTE — Progress Notes (Deleted)
Patient is here for Depo Provera Injection Patient is within Depo Provera Calender Limits yes, last given 07-25-19  Next Depo Due between: 7-20 to 8-3 Last AEX: 07-25-19 BS AEX Scheduled: 07-28-20  Patient is aware when next depo is due  Pt tolerated Injection well in LUOQ.   Routed to provider for review, encounter closed.

## 2020-01-30 ENCOUNTER — Telehealth: Payer: Self-pay | Admitting: Obstetrics and Gynecology

## 2020-01-30 NOTE — Telephone Encounter (Signed)
Patient is past due for her depo provera and would like to schedule. To triage to assess.with being past due.

## 2020-01-31 NOTE — Telephone Encounter (Signed)
Left message for pt to return call to triage RN. 

## 2020-02-04 NOTE — Telephone Encounter (Signed)
Left message for pt to return call to triage RN. 

## 2020-02-05 NOTE — Telephone Encounter (Signed)
Pt returned call. Spoke with pt. Pt states would like to start getting Depo Provera injections for birth control. Pt states had last Depo in 07/2019. Confirmed with Epic, was given 07/17/2019. Pt advised to call back with start of next cycle to schedule Depo injection. Pt agreeable.  Pt to start next cycle the week of 02/25/20. Pt verbalized understanding.  Encounter closed.

## 2020-02-05 NOTE — Telephone Encounter (Signed)
Patient returned call

## 2020-03-25 IMAGING — US OBSTETRIC <14 WK US AND TRANSVAGINAL OB US
1 series · 15 of 28 positions shown · non-contrast
Comparison: None.

CLINICAL DATA: Initial evaluation for acute cramping for 2 days,
early pregnancy.

EXAM:
OBSTETRIC <14 WK US AND TRANSVAGINAL OB US
TECHNIQUE: Both transabdominal and transvaginal ultrasound examinations were
performed for complete evaluation of the gestation as well as the
maternal uterus, adnexal regions, and pelvic cul-de-sac.
Transvaginal technique was performed to assess early pregnancy.

[Series 1: obstetric <14 wk us and transvaginal ob us · 30 acquisitions, 15 frames shown]
[im 1/30]
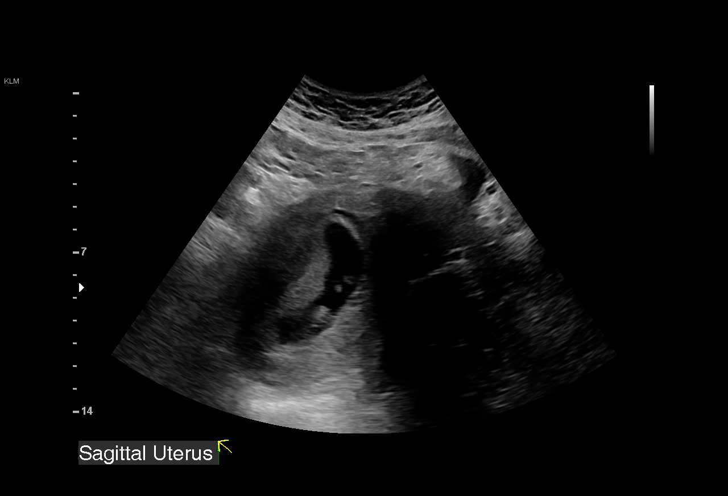
[im 3/30]
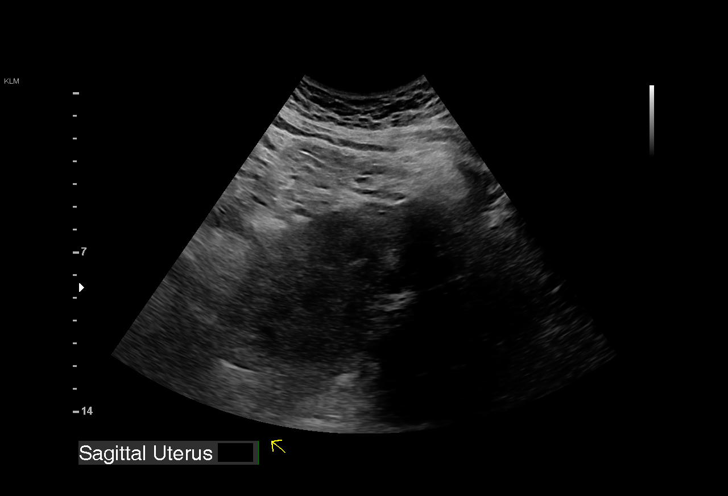
[im 5/30]
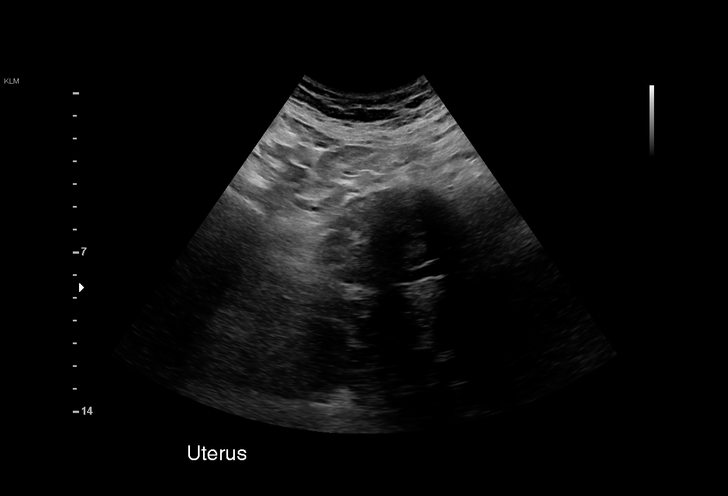
[im 7/30]
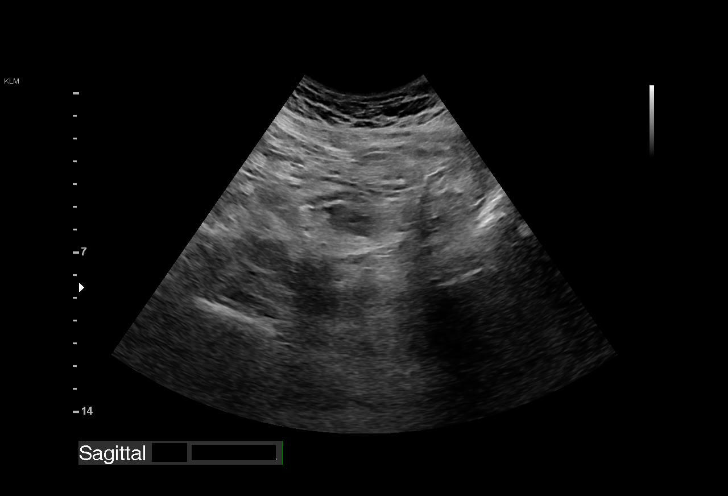
[im 9/30]
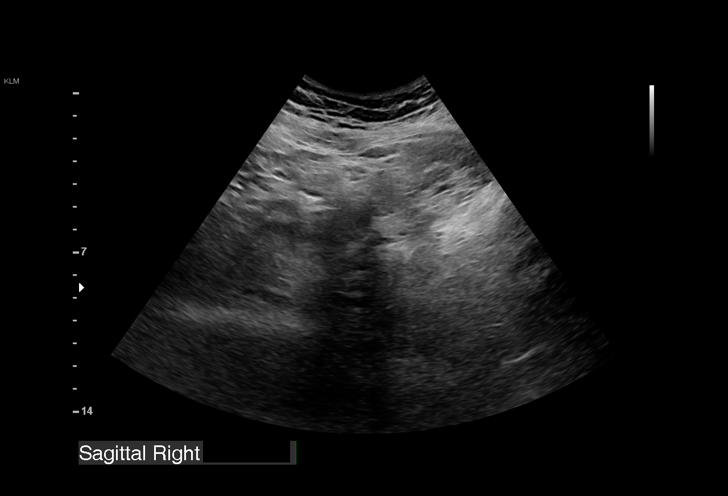
[im 11/30]
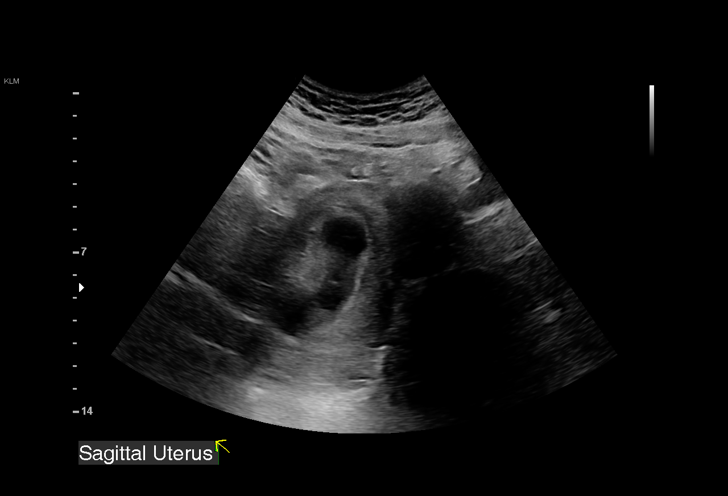
[im 13/30]
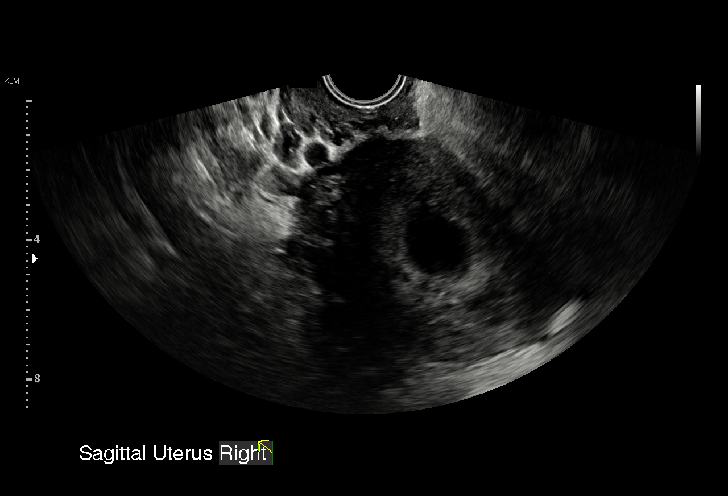
[im 16/30]
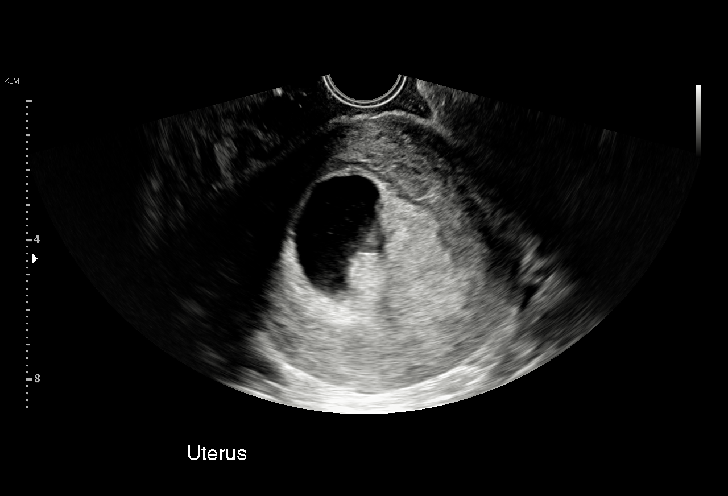
[im 17/30]
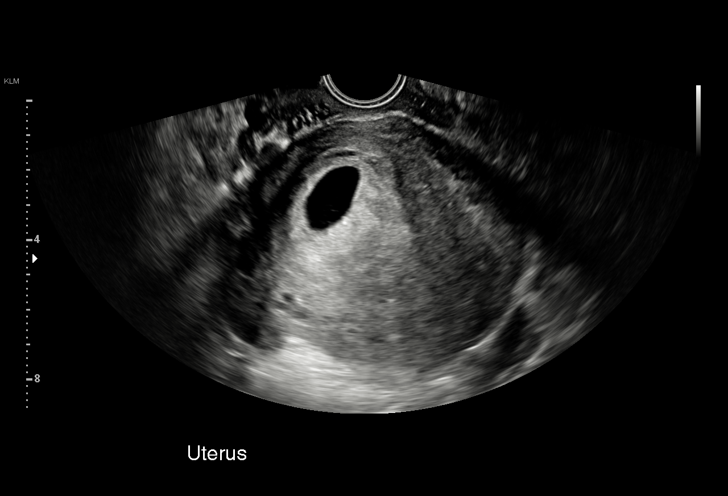
[im 19/30]
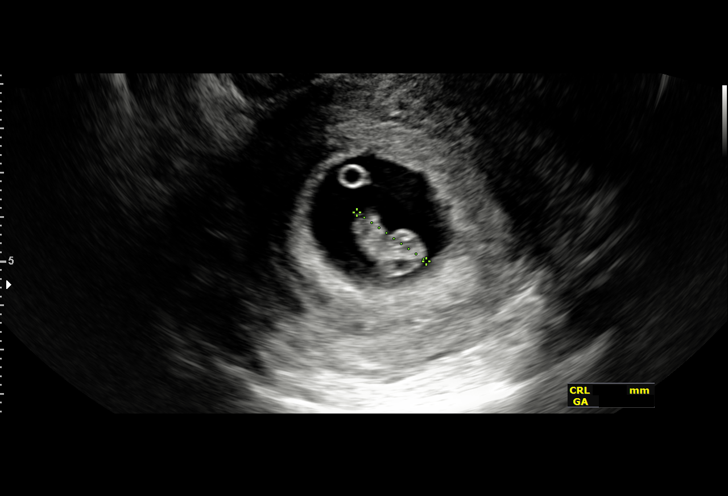
[im 21/30]
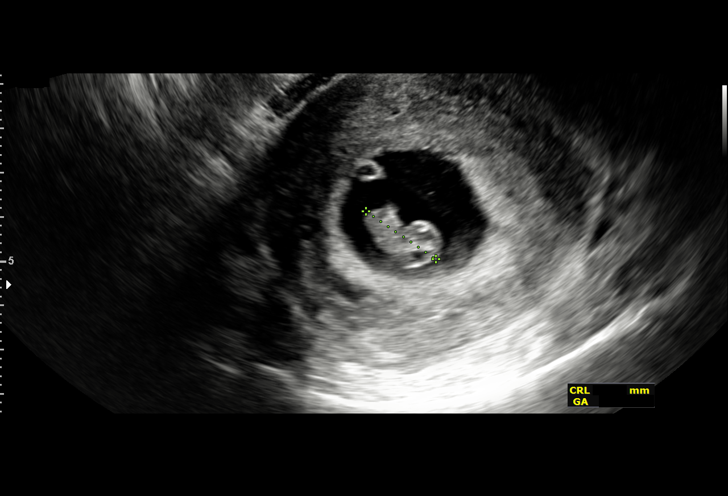
[im 23/30]
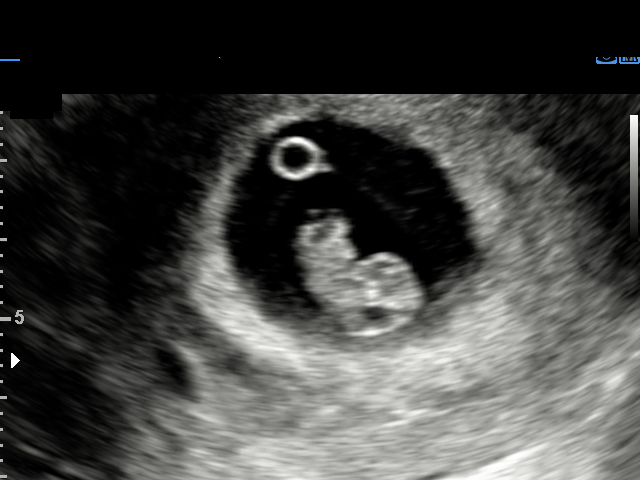
[im 25/30]
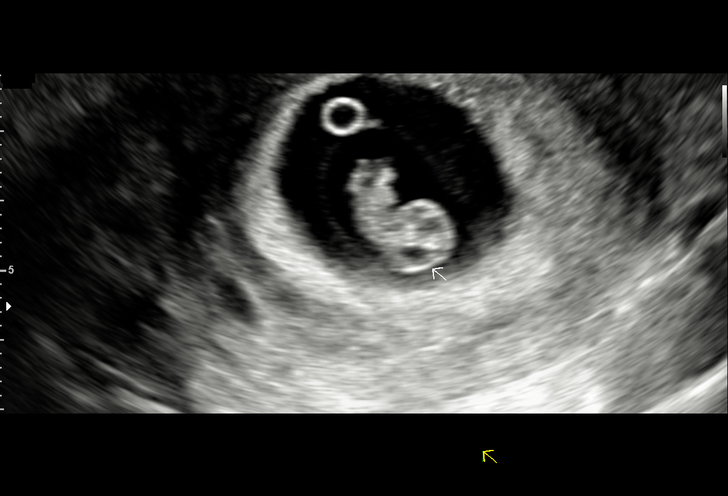
[im 27/30]
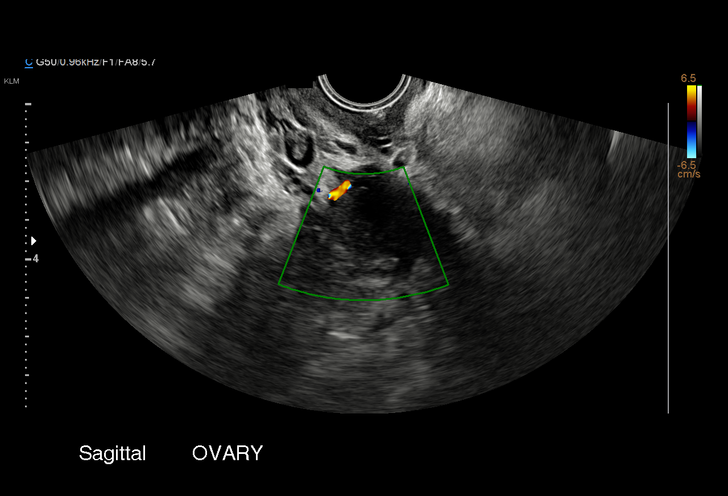
[im 30/30]
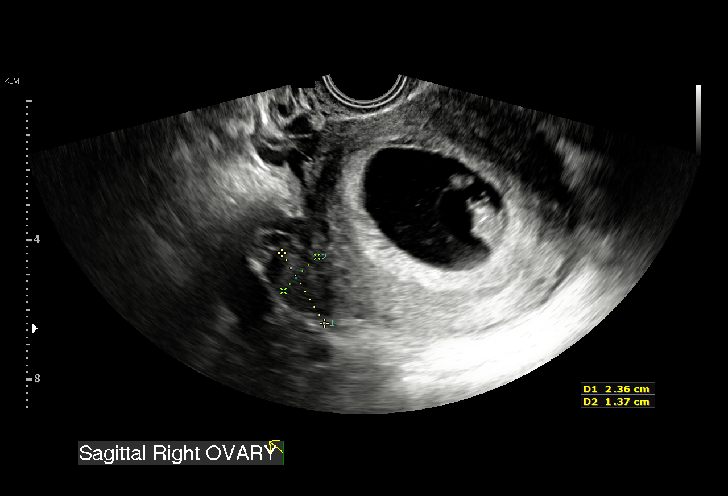

[15 of 28 positions shown; findings below may reference images not displayed]

FINDINGS: Intrauterine gestational sac: Single

Yolk sac:  Present

Embryo:  Present

Cardiac Activity: Present

Heart Rate: 158 bpm

CRL: 18.9 mm   8 w   2 d                  US EDC: 07/10/2019

Subchorionic hemorrhage:  None visualized.

Maternal uterus/adnexae: Ovaries are normal in appearance
bilaterally. Small corpus luteal cyst noted on the left. No free
fluid within the pelvis.
IMPRESSION: 1. Single viable intrauterine pregnancy as above without
complication, estimated gestational age 8 weeks and 2 days by
crown-rump length, with ultrasound EDC of 07/10/2019.
2. No other acute maternal uterine or adnexal abnormality
identified.

## 2020-04-21 ENCOUNTER — Telehealth: Payer: Self-pay

## 2020-04-21 NOTE — Telephone Encounter (Signed)
Spoke with pt. Pt calling to give update of start of cycle to restart taking Depo injections. Pt had last Depo in 07/2019 at AEX appt. Advised pt to refrain from intercourse until after injection. Pt agreeable.  Pt LMP 04/21/20.  Pt scheduled for nurse visit on 11/12 at 145 pm per pt's request due to work schedule and off on Fridays. Pt advised will give update to Dr Edward Jolly and return call with any further recommendations.  Pt agreeable.   Routing to Dr Edward Jolly.  AEX 07/2019  Ok to proceed as nurse visit to restart Depo injections?

## 2020-04-21 NOTE — Telephone Encounter (Signed)
Patient is calling to start back on Depo.

## 2020-04-22 NOTE — Telephone Encounter (Signed)
Ok to restart on Depo Provera 150 mg IM until annual exam is due as long as she is not having any irregular vaginal bleeding.

## 2020-04-22 NOTE — Telephone Encounter (Signed)
Called patient and left message to call and update [place] if she has had any irregular vaginal bleeding.

## 2020-04-24 NOTE — Telephone Encounter (Signed)
Patient canceled her depo appointment tomorrow and will call next week to reschedule.

## 2020-04-24 NOTE — Telephone Encounter (Signed)
Chart to triage to assist in rescheduling.

## 2020-04-25 ENCOUNTER — Ambulatory Visit: Payer: Self-pay

## 2020-05-06 NOTE — Telephone Encounter (Signed)
Spoke with pt. Pt states has appt with [facility] on 11/26 for Depo injection. Pt states still plans to have AEX in Feb with Dr Edward Jolly.  Routing to Dr Edward Jolly for update Encounter closed

## 2020-06-10 ENCOUNTER — Ambulatory Visit (INDEPENDENT_AMBULATORY_CARE_PROVIDER_SITE_OTHER): Payer: Medicaid Other

## 2020-06-10 ENCOUNTER — Telehealth: Payer: Self-pay

## 2020-06-10 ENCOUNTER — Other Ambulatory Visit: Payer: Self-pay

## 2020-06-10 VITALS — BP 140/82 | HR 84 | Resp 16 | Ht 67.0 in | Wt 182.0 lb

## 2020-06-10 DIAGNOSIS — Z3042 Encounter for surveillance of injectable contraceptive: Secondary | ICD-10-CM | POA: Diagnosis not present

## 2020-06-10 MED ORDER — MEDROXYPROGESTERONE ACETATE 150 MG/ML IM SUSP
150.0000 mg | Freq: Once | INTRAMUSCULAR | Status: AC
  Administered 2020-06-10: 150 mg via INTRAMUSCULAR

## 2020-06-10 NOTE — Telephone Encounter (Signed)
Patient would like to restart depo provera.

## 2020-06-10 NOTE — Progress Notes (Signed)
Patient is here for Depo Provera Injection Patient is within Depo Provera Calender Limits yes Next Depo Due between: 3/15-3/29 Last AEX: 07/25/19 Dr. Jose Persia Scheduled: 07/28/20 Dr. Edward Jolly  Patient is aware when next depo is due  Pt tolerated Injection well in LUOQ.  Routed to provider for review, encounter closed.

## 2020-06-10 NOTE — Telephone Encounter (Signed)
Spoke with patient.  LMP 06/07/20 Requesting to schedule depo-provera with current menses.  Reviewed previous telephone encounter dated 04/21/20, ok to proceed with depo-provera until AEX due and as long as no irregular bleeding.   Last AEX 07/25/19.  Patient denies any irregular bleeding.  Nurse visit scheduled for today at 3:45pm.  Patient verbalizes understanding and is agreeable.   Routing to provider for final review. Patient is agreeable to disposition. Will close encounter.

## 2020-07-28 ENCOUNTER — Ambulatory Visit: Payer: Self-pay | Admitting: Obstetrics and Gynecology

## 2020-08-28 ENCOUNTER — Encounter: Payer: Self-pay | Admitting: Obstetrics and Gynecology

## 2020-08-28 ENCOUNTER — Ambulatory Visit (INDEPENDENT_AMBULATORY_CARE_PROVIDER_SITE_OTHER): Payer: Medicaid Other | Admitting: Obstetrics and Gynecology

## 2020-08-28 ENCOUNTER — Telehealth: Payer: Self-pay | Admitting: Obstetrics and Gynecology

## 2020-08-28 ENCOUNTER — Other Ambulatory Visit: Payer: Self-pay

## 2020-08-28 ENCOUNTER — Other Ambulatory Visit (HOSPITAL_COMMUNITY)
Admission: RE | Admit: 2020-08-28 | Discharge: 2020-08-28 | Disposition: A | Discharge status: Home / Self Care | Payer: 59 | Source: Ambulatory Visit | Attending: Obstetrics and Gynecology | Admitting: Obstetrics and Gynecology

## 2020-08-28 VITALS — BP 130/80 | HR 80 | Resp 20 | Ht 67.5 in | Wt 183.0 lb

## 2020-08-28 DIAGNOSIS — Z3042 Encounter for surveillance of injectable contraceptive: Secondary | ICD-10-CM

## 2020-08-28 DIAGNOSIS — Z113 Encounter for screening for infections with a predominantly sexual mode of transmission: Secondary | ICD-10-CM | POA: Diagnosis not present

## 2020-08-28 DIAGNOSIS — Z01419 Encounter for gynecological examination (general) (routine) without abnormal findings: Secondary | ICD-10-CM

## 2020-08-28 DIAGNOSIS — R7989 Other specified abnormal findings of blood chemistry: Secondary | ICD-10-CM

## 2020-08-28 DIAGNOSIS — N898 Other specified noninflammatory disorders of vagina: Secondary | ICD-10-CM

## 2020-08-28 LAB — COMPREHENSIVE METABOLIC PANEL
CO2: 23 mmol/L (ref 20–32)
Sodium: 138 mmol/L (ref 135–146)

## 2020-08-28 LAB — LIPID PANEL: Triglycerides: 126 mg/dL (ref <150)

## 2020-08-28 LAB — WET PREP FOR TRICH, YEAST, CLUE

## 2020-08-28 MED ORDER — METRONIDAZOLE 500 MG PO TABS: 500.0000 mg | ORAL_TABLET | Freq: Two times a day (BID) | ORAL | 0 refills | Status: DC

## 2020-08-28 MED ORDER — MEDROXYPROGESTERONE ACETATE 150 MG/ML IM SUSP
150.0000 mg | Freq: Once | INTRAMUSCULAR | Status: AC
  Administered 2020-08-28: 150 mg via INTRAMUSCULAR

## 2020-08-28 NOTE — Patient Instructions (Addendum)
EXERCISE AND DIET:  We recommended that you start or continue a regular exercise program for good health. Regular exercise means any activity that makes your heart beat faster and makes you sweat.  We recommend exercising at least 30 minutes per day at least 3 days a week, preferably 4 or 5.  We also recommend a diet low in fat and sugar.  Inactivity, poor dietary choices and obesity can cause diabetes, heart attack, stroke, and kidney damage, among others.    ALCOHOL AND SMOKING:  Women should limit their alcohol intake to no more than 7 drinks/beers/glasses of wine (combined, not each!) per week. Moderation of alcohol intake to this level decreases your risk of breast cancer and liver damage. And of course, no recreational drugs are part of a healthy lifestyle.  And absolutely no smoking or even second hand smoke. Most people know smoking can cause heart and lung diseases, but did you know it also contributes to weakening of your bones? Aging of your skin?  Yellowing of your teeth and nails?  CALCIUM AND VITAMIN D:  Adequate intake of calcium and Vitamin D are recommended.  The recommendations for exact amounts of these supplements seem to change often, but generally speaking 600 mg of calcium (either carbonate or citrate) and 800 units of Vitamin D per day seems prudent. Certain women may benefit from higher intake of Vitamin D.  If you are among these women, your doctor will have told you during your visit.    PAP SMEARS:  Pap smears, to check for cervical cancer or precancers,  have traditionally been done yearly, although recent scientific advances have shown that most women can have pap smears less often.  However, every woman still should have a physical exam from her gynecologist every year. It will include a breast check, inspection of the vulva and vagina to check for abnormal growths or skin changes, a visual exam of the cervix, and then an exam to evaluate the size and shape of the uterus and  ovaries.  And after 40 years of age, a rectal exam is indicated to check for rectal cancers. We will also provide age appropriate advice regarding health maintenance, like when you should have certain vaccines, screening for sexually transmitted diseases, bone density testing, colonoscopy, mammograms, etc.   MAMMOGRAMS:  All women over 40 years old should have a yearly mammogram. Many facilities now offer a "3D" mammogram, which may cost around $50 extra out of pocket. If possible,  we recommend you accept the option to have the 3D mammogram performed.  It both reduces the number of women who will be called back for extra views which then turn out to be normal, and it is better than the routine mammogram at detecting truly abnormal areas.    COLONOSCOPY:  Colonoscopy to screen for colon cancer is recommended for all women at age 50.  We know, you hate the idea of the prep.  We agree, BUT, having colon cancer and not knowing it is worse!!  Colon cancer so often starts as a polyp that can be seen and removed at colonscopy, which can quite literally save your life!  And if your first colonoscopy is normal and you have no family history of colon cancer, most women don't have to have it again for 10 years.  Once every ten years, you can do something that may end up saving your life, right?  We will be happy to help you get it scheduled when you are ready.    Be sure to check your insurance coverage so you understand how much it will cost.  It may be covered as a preventative service at no cost, but you should check your particular policy.      Bacterial Vaginosis  Bacterial vaginosis is an infection of the vagina. It happens when too many normal germs (healthy bacteria) grow in the vagina. This infection can make it easier to get other infections from sex (STIs). It is very important for pregnant women to get treated. This infection can cause babies to be born early or at a low birth weight. What are the  causes? This infection is caused by an increase in certain germs that grow in the vagina. You cannot get this infection from toilet seats, bedsheets, swimming pools, or things that touch your vagina. What increases the risk?  Having sex with a new person or more than one person.  Having sex without protection.  Douching.  Having an intrauterine device (IUD).  Smoking.  Using drugs or drinking alcohol. These can lead you to do things that are risky.  Taking certain antibiotic medicines.  Being pregnant. What are the signs or symptoms? Some women have no symptoms. Symptoms may include:  A discharge from your vagina. It may be gray or white. It can be watery or foamy.  A fishy smell. This can happen after sex or during your menstrual period.  Itching in and around your vagina.  A feeling of burning or pain when you pee (urinate). How is this treated? This infection is treated with antibiotic medicines. These may be given to you as:  A pill.  A cream for your vagina.  A medicine that you put into your vagina (suppository). If the infection comes back after treatment, you may need more antibiotics. Follow these instructions at home: Medicines  Take over-the-counter and prescription medicines as told by your doctor.  Take or use your antibiotic medicine as told by your doctor. Do not stop taking or using it, even if you start to feel better. General instructions  If the person you have sex with is a woman, tell her that you have this infection. She will need to follow up with her doctor. If you have a female partner, he does not need to be treated.  Do not have sex until you finish treatment.  Drink enough fluid to keep your pee pale yellow.  Keep your vagina and butt clean. ? Wash the area with warm water each day. ? Wipe from front to back after you use the toilet.  If you are breastfeeding a baby, ask your doctor if you should keep doing so during  treatment.  Keep all follow-up visits. How is this prevented? Self-care  Do not douche.  Use only warm water to wash around your vagina.  Wear underwear that is cotton or lined with cotton.  Do not wear tight pants and pantyhose, especially in the summer. Safe sex  Use protection when you have sex. This includes: ? Use condoms. ? Use dental dams. This is a thin layer that protects the mouth during oral sex.  Limit how many people you have sex with. To prevent this infection, it is best to have sex with just one person.  Get tested for STIs. The person you have sex with should also get tested. Drugs and alcohol  Do not smoke or use any products that contain nicotine or tobacco. If you need help quitting, ask your doctor.  Do not use drugs.  Do not drink alcohol if: ? Your doctor tells you not to drink. ? You are pregnant, may be pregnant, or are planning to become pregnant.  If you drink alcohol: ? Limit how much you have to 0-1 drink a day. ? Know how much alcohol is in your drink. In the [place] one drink equals one 12 oz bottle of beer (355 mL), one 5 oz glass of wine (148 mL), or one 1 oz glass of hard liquor (44 mL). Where to find more information  Centers for Disease Control and Prevention: FootballExhibition.com.br  American Sexual Health Association: www.ashastd.org  Office on [employer] Health: http://hoffman.com/ Contact a doctor if:  Your symptoms do not get better, even after you are treated.  You have more discharge or pain when you pee.  You have a fever or chills.  You have pain in your belly (abdomen) or in the area between your hips.  You have pain with sex.  You bleed from your vagina between menstrual periods. Summary  This infection can happen when too many germs (bacteria) grow in the vagina.  This infection can make it easier to get infections from sex (STIs). Treating this can lower that chance.  Get treated if you are pregnant. This infection can  cause babies to be born early.  Do not stop taking or using your antibiotic medicine, even if you start to feel better. This information is not intended to replace advice given to you by your health care provider. Make sure you discuss any questions you have with your health care provider. Document Revised: 11/29/2019 Document Reviewed: 11/29/2019 Elsevier Patient Education  2021 [employer].
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
## 2020-08-28 NOTE — Telephone Encounter (Addendum)
Please have the lab add vitamin D level and Hep B Surface Antigen  to labs drawn today.  Codes are low vit D level and STD screening.

## 2020-08-29 LAB — CYTOLOGY - PAP
Chlamydia: NEGATIVE
Comment: NEGATIVE
Comment: NEGATIVE
Comment: NORMAL
Diagnosis: NEGATIVE
High risk HPV: NEGATIVE
Neisseria Gonorrhea: NEGATIVE

## 2020-08-29 LAB — COMPREHENSIVE METABOLIC PANEL

## 2020-08-29 NOTE — Telephone Encounter (Signed)
Spoke with [place] our lab tech regarding add on labs. She will place the order for these in her system.

## 2020-09-02 LAB — CBC: HCT: 37.1 % (ref 35.0–45.0)

## 2020-09-03 LAB — COMPREHENSIVE METABOLIC PANEL
AG Ratio: 1.3 (calc) (ref 1.0–2.5)
ALT: 12 U/L (ref 6–29)
AST: 12 U/L (ref 10–30)
Albumin: 4.1 g/dL (ref 3.6–5.1)
Alkaline phosphatase (APISO): 62 U/L (ref 31–125)
BUN: 9 mg/dL (ref 7–25)
Calcium: 9.4 mg/dL (ref 8.6–10.2)
Chloride: 107 mmol/L (ref 98–110)
Creat: 0.77 mg/dL (ref 0.50–1.10)
Globulin: 3.1 g/dL (calc) (ref 1.9–3.7)
Glucose, Bld: 91 mg/dL (ref 65–99)
Potassium: 4 mmol/L (ref 3.5–5.3)
Total Bilirubin: 0.4 mg/dL (ref 0.2–1.2)
Total Protein: 7.2 g/dL (ref 6.1–8.1)

## 2020-09-03 LAB — HEPATITIS B SURFACE ANTIGEN: Hepatitis B Surface Ag: NONREACTIVE

## 2020-09-03 LAB — VITAMIN D 1,25 DIHYDROXY
Vitamin D 1, 25 (OH)2 Total: 49 pg/mL (ref 18–72)
Vitamin D2 1, 25 (OH)2: 9 pg/mL
Vitamin D3 1, 25 (OH)2: 40 pg/mL

## 2020-09-03 LAB — CBC
Hemoglobin: 12.5 g/dL (ref 11.7–15.5)
MCH: 31.3 pg (ref 27.0–33.0)
MCHC: 33.7 g/dL (ref 32.0–36.0)
MCV: 93 fL (ref 80.0–100.0)
MPV: 10.1 fL (ref 7.5–12.5)
Platelets: 320 10*3/uL (ref 140–400)
RBC: 3.99 10*6/uL (ref 3.80–5.10)
RDW: 12.6 % (ref 11.0–15.0)
WBC: 5.7 10*3/uL (ref 3.8–10.8)

## 2020-09-03 LAB — HEPATITIS C ANTIBODY
Hepatitis C Ab: NONREACTIVE
SIGNAL TO CUT-OFF: 0 (ref <1.00)

## 2020-09-03 LAB — LIPID PANEL
Cholesterol: 182 mg/dL (ref <200)
HDL: 50 mg/dL (ref >=50)
LDL Cholesterol (Calc): 108 mg/dL (calc) — ABNORMAL HIGH
Non-HDL Cholesterol (Calc): 132 mg/dL (calc) — ABNORMAL HIGH (ref <130)
Total CHOL/HDL Ratio: 3.6 (calc) (ref <5.0)

## 2020-09-03 LAB — RPR: RPR Ser Ql: NONREACTIVE

## 2020-09-03 LAB — HIV ANTIBODY (ROUTINE TESTING W REFLEX): HIV 1&2 Ab, 4th Generation: NONREACTIVE

## 2020-09-05 ENCOUNTER — Other Ambulatory Visit: Payer: Self-pay | Admitting: *Deleted

## 2020-09-05 ENCOUNTER — Encounter: Payer: Self-pay | Admitting: Obstetrics and Gynecology

## 2020-12-04 ENCOUNTER — Telehealth: Payer: Self-pay

## 2020-12-04 NOTE — Telephone Encounter (Signed)
Patient has appt for her next depo tomorrow. Patient was due to get her depo between June 2nd-16th. This puts her outside of her window to get her depo on time.  Left message for patient to call me back regarding her appt to get a little more information regarding if she has had any spotting or bleeding, if she uses condoms with the depo provera & if she is sexually active/when last encounter was.

## 2020-12-05 ENCOUNTER — Ambulatory Visit: Payer: Medicaid Other

## 2020-12-12 ENCOUNTER — Other Ambulatory Visit: Payer: Self-pay

## 2020-12-12 ENCOUNTER — Ambulatory Visit (INDEPENDENT_AMBULATORY_CARE_PROVIDER_SITE_OTHER): Payer: Medicaid Other | Admitting: Anesthesiology

## 2020-12-12 DIAGNOSIS — Z01812 Encounter for preprocedural laboratory examination: Secondary | ICD-10-CM

## 2020-12-12 DIAGNOSIS — Z3042 Encounter for surveillance of injectable contraceptive: Secondary | ICD-10-CM | POA: Diagnosis not present

## 2020-12-12 LAB — PREGNANCY, URINE: Preg Test, Ur: NEGATIVE

## 2020-12-12 MED ORDER — MEDROXYPROGESTERONE ACETATE 150 MG/ML IM SUSP
150.0000 mg | Freq: Once | INTRAMUSCULAR | Status: AC
  Administered 2020-12-12: 150 mg via INTRAMUSCULAR

## 2020-12-12 NOTE — Telephone Encounter (Signed)
Patient had appt 12-12-20. Encounter closed

## 2021-02-03 ENCOUNTER — Emergency Department (HOSPITAL_BASED_OUTPATIENT_CLINIC_OR_DEPARTMENT_OTHER)
Admission: EM | Admit: 2021-02-03 | Discharge: 2021-02-03 | Disposition: A | Discharge status: Home / Self Care | Payer: Medicaid Other | Attending: Emergency Medicine | Admitting: Emergency Medicine

## 2021-02-03 ENCOUNTER — Encounter (HOSPITAL_BASED_OUTPATIENT_CLINIC_OR_DEPARTMENT_OTHER): Payer: Self-pay | Admitting: Emergency Medicine

## 2021-02-03 ENCOUNTER — Other Ambulatory Visit: Payer: Self-pay

## 2021-02-03 DIAGNOSIS — F17210 Nicotine dependence, cigarettes, uncomplicated: Secondary | ICD-10-CM | POA: Insufficient documentation

## 2021-02-03 DIAGNOSIS — M545 Low back pain, unspecified: Secondary | ICD-10-CM | POA: Diagnosis not present

## 2021-02-03 MED ORDER — OXYCODONE-ACETAMINOPHEN 5-325 MG PO TABS: 1.0000 | ORAL_TABLET | Freq: Four times a day (QID) | ORAL | 0 refills | Status: DC | PRN

## 2021-02-03 MED ORDER — DEXAMETHASONE SODIUM PHOSPHATE 10 MG/ML IJ SOLN
10.0000 mg | Freq: Once | INTRAMUSCULAR | Status: AC
  Administered 2021-02-03: 10 mg via INTRAMUSCULAR
  Filled 2021-02-03: qty 1

## 2021-02-03 NOTE — Discharge Instructions (Addendum)
Call your primary care doctor or specialist as discussed in the next 2-3 days.   Return immediately back to the ER if:  Your symptoms worsen within the next 12-24 hours. You develop new symptoms such as new fevers, persistent vomiting, new pain, shortness of breath, or new weakness or numbness, or if you have any other concerns.  

## 2021-02-03 NOTE — ED Triage Notes (Signed)
Pt is from home related to back pain. Pt states she has been going to physical therapy and receiving Cortizone shots for same. Pt stated that she has just moved from [place] and she has no been going to Physical therapy or receiving Cortizone shots since the move.
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
## 2021-03-06 ENCOUNTER — Other Ambulatory Visit: Payer: Self-pay

## 2021-03-06 ENCOUNTER — Ambulatory Visit (INDEPENDENT_AMBULATORY_CARE_PROVIDER_SITE_OTHER): Payer: Medicaid Other | Admitting: *Deleted

## 2021-03-06 ENCOUNTER — Ambulatory Visit: Payer: Medicaid Other

## 2021-03-06 DIAGNOSIS — Z3042 Encounter for surveillance of injectable contraceptive: Secondary | ICD-10-CM | POA: Diagnosis not present

## 2021-03-06 MED ORDER — MEDROXYPROGESTERONE ACETATE 150 MG/ML IM SUSP
150.0000 mg | Freq: Once | INTRAMUSCULAR | Status: AC
  Administered 2021-03-06: 150 mg via INTRAMUSCULAR

## 2021-03-09 ENCOUNTER — Ambulatory Visit: Payer: Medicaid Other

## 2021-03-19 ENCOUNTER — Other Ambulatory Visit: Payer: Self-pay

## 2021-03-19 ENCOUNTER — Emergency Department (HOSPITAL_BASED_OUTPATIENT_CLINIC_OR_DEPARTMENT_OTHER)
Admission: EM | Admit: 2021-03-19 | Discharge: 2021-03-19 | Disposition: A | Discharge status: Home / Self Care | Payer: Medicaid Other | Attending: Emergency Medicine | Admitting: Emergency Medicine

## 2021-03-19 ENCOUNTER — Encounter (HOSPITAL_BASED_OUTPATIENT_CLINIC_OR_DEPARTMENT_OTHER): Payer: Self-pay | Admitting: *Deleted

## 2021-03-19 DIAGNOSIS — F17210 Nicotine dependence, cigarettes, uncomplicated: Secondary | ICD-10-CM | POA: Diagnosis not present

## 2021-03-19 DIAGNOSIS — M545 Low back pain, unspecified: Secondary | ICD-10-CM | POA: Diagnosis not present

## 2021-03-19 DIAGNOSIS — M5410 Radiculopathy, site unspecified: Secondary | ICD-10-CM

## 2021-03-19 MED ORDER — IBUPROFEN 600 MG PO TABS: 600.0000 mg | ORAL_TABLET | Freq: Four times a day (QID) | ORAL | 0 refills | Status: AC | PRN

## 2021-03-19 MED ORDER — PREDNISONE 20 MG PO TABS: ORAL_TABLET | ORAL | 0 refills | Status: DC

## 2021-03-19 MED ORDER — CYCLOBENZAPRINE HCL 10 MG PO TABS: 10.0000 mg | ORAL_TABLET | Freq: Two times a day (BID) | ORAL | 0 refills | Status: AC | PRN

## 2021-03-19 NOTE — ED Triage Notes (Signed)
Back pain radiating to her right thigh since last night.  Denies any injuries.  Pt had a history of pinch nerve 6 or 7 months ago.
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
## 2021-05-25 ENCOUNTER — Ambulatory Visit: Payer: Medicaid Other

## 2021-05-25 NOTE — Progress Notes (Deleted)
Patient is here for Depo Provera Injection Patient is within Depo Provera Calender Limits yes last given 03/06/21  Next Depo Due between: Feb 27-March 13 Last AEX: 08/28/20  AEX Scheduled: none scheduled   Patient is aware when next depo is due  Pt tolerated Injection well.  Routed to provider for review, encounter closed.

## 2021-07-08 ENCOUNTER — Other Ambulatory Visit: Payer: Self-pay

## 2021-07-08 ENCOUNTER — Ambulatory Visit (INDEPENDENT_AMBULATORY_CARE_PROVIDER_SITE_OTHER): Payer: Medicaid Other | Admitting: Gynecology

## 2021-07-08 DIAGNOSIS — Z3042 Encounter for surveillance of injectable contraceptive: Secondary | ICD-10-CM | POA: Diagnosis not present

## 2021-07-08 DIAGNOSIS — Z01812 Encounter for preprocedural laboratory examination: Secondary | ICD-10-CM

## 2021-07-08 LAB — PREGNANCY, URINE: Preg Test, Ur: NEGATIVE

## 2021-07-08 MED ORDER — MEDROXYPROGESTERONE ACETATE 150 MG/ML IM SUSP
150.0000 mg | Freq: Once | INTRAMUSCULAR | Status: AC
  Administered 2021-07-08: 16:00:00 150 mg via INTRAMUSCULAR

## 2021-08-05 ENCOUNTER — Telehealth: Payer: Self-pay

## 2021-08-05 NOTE — Telephone Encounter (Signed)
Patient called requesting Rx for vaginal infection symptoms. I told her Dr. Edward Jolly recommends office visit for vaginal symptoms and offered to have appt desk call to schedule her. Patient agrees.

## 2021-08-05 NOTE — Telephone Encounter (Signed)
Message sent to appt desk to call her to schedule.

## 2021-08-06 NOTE — Telephone Encounter (Signed)
It is patient's choice if she wishes to be seen tomorrow or wait until next week.   I will see her for her annual exam in March!

## 2021-08-06 NOTE — Telephone Encounter (Signed)
Lindsey [name] called to schedule patient an appointment. Patient cannot come in today. She asked if okay to schedule her with Clearnce Hasten, NP tomorrow or did you want her to wait to see you next week?

## 2021-08-07 ENCOUNTER — Ambulatory Visit: Payer: Medicaid Other | Admitting: Radiology

## 2021-08-07 DIAGNOSIS — Z0289 Encounter for other administrative examinations: Secondary | ICD-10-CM

## 2021-08-10 NOTE — Telephone Encounter (Addendum)
Lindsey [name] spoke with patient and gave her option to see NP this week of wait to see Dr. Edward Jolly next week. Patient had visit scheduled with Clearnce Hasten, NP on 08/07/21 but was did not show for the appointemnt.

## 2021-08-27 ENCOUNTER — Ambulatory Visit: Payer: Medicaid Other | Admitting: Obstetrics and Gynecology
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
## 2021-09-02 ENCOUNTER — Ambulatory Visit (INDEPENDENT_AMBULATORY_CARE_PROVIDER_SITE_OTHER): Payer: Medicaid Other | Admitting: Obstetrics and Gynecology

## 2021-09-02 ENCOUNTER — Encounter: Payer: Self-pay | Admitting: Obstetrics and Gynecology

## 2021-09-02 ENCOUNTER — Other Ambulatory Visit (HOSPITAL_COMMUNITY)
Admission: RE | Admit: 2021-09-02 | Discharge: 2021-09-02 | Disposition: A | Discharge status: Home / Self Care | Payer: Medicaid Other | Source: Ambulatory Visit | Attending: Obstetrics and Gynecology | Admitting: Obstetrics and Gynecology

## 2021-09-02 ENCOUNTER — Other Ambulatory Visit: Payer: Self-pay

## 2021-09-02 VITALS — BP 124/84 | HR 82 | Ht 68.0 in | Wt 180.0 lb

## 2021-09-02 DIAGNOSIS — G4700 Insomnia, unspecified: Secondary | ICD-10-CM | POA: Diagnosis not present

## 2021-09-02 DIAGNOSIS — N951 Menopausal and female climacteric states: Secondary | ICD-10-CM

## 2021-09-02 DIAGNOSIS — Z113 Encounter for screening for infections with a predominantly sexual mode of transmission: Secondary | ICD-10-CM | POA: Diagnosis present

## 2021-09-02 DIAGNOSIS — Z01419 Encounter for gynecological examination (general) (routine) without abnormal findings: Secondary | ICD-10-CM | POA: Diagnosis not present

## 2021-09-02 DIAGNOSIS — F439 Reaction to severe stress, unspecified: Secondary | ICD-10-CM | POA: Diagnosis not present

## 2021-09-02 DIAGNOSIS — Z119 Encounter for screening for infectious and parasitic diseases, unspecified: Secondary | ICD-10-CM | POA: Insufficient documentation

## 2021-09-02 DIAGNOSIS — Z3042 Encounter for surveillance of injectable contraceptive: Secondary | ICD-10-CM

## 2021-09-02 MED ORDER — TRAZODONE HCL 50 MG PO TABS: 50.0000 mg | ORAL_TABLET | Freq: Every day | ORAL | 1 refills | Status: AC

## 2021-09-02 NOTE — Patient Instructions (Addendum)
? ?Trazodone Tablets ?What is this medication? ?TRAZODONE (TRAZ oh done) treats depression. It increases the amount of serotonin in the brain, a hormone that helps regulate mood. ?This medicine may be used for other purposes; ask your health care provider or pharmacist if you have questions. ?COMMON BRAND NAME(S): Desyrel ?What should I tell my care team before I take this medication? ?They need to know if you have any of these conditions: ?Attempted suicide or thinking about it ?Bipolar disorder ?Bleeding problems ?Glaucoma ?Heart disease, or previous heart attack ?Irregular heart beat ?Kidney or liver disease ?Low levels of sodium in the blood ?An unusual or allergic reaction to trazodone, other medications, foods, dyes or preservatives ?Pregnant or trying to get pregnant ?Breast-feeding ?How should I use this medication? ?Take this medication by mouth with a glass of water. Follow the directions on the prescription label. Take this medication shortly after a meal or a light snack. Take your medication at regular intervals. Do not take your medication more often than directed. Do not stop taking this medication suddenly except upon the advice of your care team. Stopping this medication too quickly may cause serious side effects or your condition may worsen. ?A special MedGuide will be given to you by the pharmacist with each prescription and refill. Be sure to read this information carefully each time. ?Talk to your care team regarding the use of this medication in children. Special care may be needed. ?Overdosage: If you think you have taken too much of this medicine contact a poison control center or emergency room at once. ?NOTE: This medicine is only for you. Do not share this medicine with others. ?What if I miss a dose? ?If you miss a dose, take it as soon as you can. If it is almost time for your next dose, take only that dose. Do not take double or extra doses. ?What may interact with this medication? ?Do  not take this medication with any of the following: ?Certain medications for fungal infections like fluconazole, itraconazole, ketoconazole, posaconazole, voriconazole ?Cisapride ?Dronedarone ?Linezolid ?MAOIs like Carbex, Eldepryl, Marplan, Nardil, and Parnate ?Mesoridazine ?Methylene blue (injected into a vein) ?Pimozide ?Saquinavir ?Thioridazine ?This medication may also interact with the following: ?Alcohol ?Antiviral medications for HIV or AIDS ?Aspirin and aspirin-like medications ?Barbiturates like phenobarbital ?Certain medications for blood pressure, heart disease, irregular heart beat ?Certain medications for depression, anxiety, or psychotic disturbances ?Certain medications for migraine headache like almotriptan, eletriptan, frovatriptan, naratriptan, rizatriptan, sumatriptan, zolmitriptan ?Certain medications for seizures like carbamazepine and phenytoin ?Certain medications for sleep ?Certain medications that treat or prevent blood clots like dalteparin, enoxaparin, warfarin ?Digoxin ?Fentanyl ?Lithium ?NSAIDS, medications for pain and inflammation, like ibuprofen or naproxen ?Other medications that prolong the QT interval (cause an abnormal heart rhythm) like dofetilide ?Rasagiline ?Supplements like St. John's wort, kava kava, valerian ?Tramadol ?Tryptophan ?This list may not describe all possible interactions. Give your health care provider a list of all the medicines, herbs, non-prescription drugs, or dietary supplements you use. Also tell them if you smoke, drink alcohol, or use illegal drugs. Some items may interact with your medicine. ?What should I watch for while using this medication? ?Tell your care team if your symptoms do not get better or if they get worse. Visit your care team for regular checks on your progress. Because it may take several weeks to see the full effects of this medication, it is important to continue your treatment as prescribed by your care team. ?Watch for new or  worsening thoughts  of suicide or depression. This includes sudden changes in mood, behaviors, or thoughts. These changes can happen at any time but are more common in the beginning of treatment or after a change in dose. Call your care team right away if you experience these thoughts or worsening depression. ?Manic episodes may happen in patients with bipolar disorder who take this medication. Watch for changes in feelings or behaviors such as feeling anxious, nervous, agitated, panicky, irritable, hostile, aggressive, impulsive, severely restless, overly excited and hyperactive, or trouble sleeping. These changes can happen at any time but are more common in the beginning of treatment or after a change in dose. Call your care team right away if you notice any of these symptoms. ?You may get drowsy or dizzy. Do not drive, use machinery, or do anything that needs mental alertness until you know how this medication affects you. Do not stand or sit up quickly, especially if you are an older patient. This reduces the risk of dizzy or fainting spells. Alcohol may interfere with the effect of this medication. Avoid alcoholic drinks. ?This medication may cause dry eyes and blurred vision. If you wear contact lenses you may feel some discomfort. Lubricating drops may help. See your eye doctor if the problem does not go away or is severe. ?Your mouth may get dry. Chewing sugarless gum, sucking hard candy and drinking plenty of water may help. Contact your care team if the problem does not go away or is severe. ?What side effects may I notice from receiving this medication? ?Side effects that you should report to your care team as soon as possible: ?Allergic reactions--skin rash, itching, hives, swelling of the face, lips, tongue, or throat ?Bleeding--bloody or black, tar-like stools, red or dark brown urine, vomiting blood or brown material that looks like coffee grounds, small, red or purple spots on skin, unusual bleeding  or bruising ?Heart rhythm changes--fast or irregular heartbeat, dizziness, feeling faint or lightheaded, chest pain, trouble breathing ?Low blood pressure--dizziness, feeling faint or lightheaded, blurry vision ?Low sodium level--muscle weakness, fatigue, dizziness, headache, confusion ?Prolonged or painful erection ?Serotonin syndrome--irritability, confusion, fast or irregular heartbeat, muscle stiffness, twitching muscles, sweating, high fever, seizures, chills, vomiting, diarrhea ?Sudden eye pain or change in vision such as blurry vision, seeing halos around lights, vision loss ?Thoughts of suicide or self-harm, worsening mood, feelings of depression ?Side effects that usually do not require medical attention (report to your care team if they continue or are bothersome): ?Change in sex drive or performance ?Constipation ?Dizziness ?Drowsiness ?Dry mouth ?This list may not describe all possible side effects. Call your doctor for medical advice about side effects. You may report side effects to FDA at 1-800-FDA-1088. ?Where should I keep my medication? ?Keep out of the reach of children and pets. ?Store at room temperature between 15 and 30 degrees C (59 to 86 degrees F). Protect from light. Keep container tightly closed. Throw away any unused medication after the expiration date. ?NOTE: This sheet is a summary. It may not cover all possible information. If you have questions about this medicine, talk to your doctor, pharmacist, or health care provider. ?? 2022 Elsevier/Gold Standard (2020-05-21 00:00:00) ? ?EXERCISE AND DIET:  We recommended that you start or continue a regular exercise program for good health. Regular exercise means any activity that makes your heart beat faster and makes you sweat.  We recommend exercising at least 30 minutes per day at least 3 days a week, preferably 4 or 5.  We also  recommend a diet low in fat and sugar.  Inactivity, poor dietary choices and obesity can cause diabetes, heart  attack, stroke, and kidney damage, among others.   ? ?ALCOHOL AND SMOKING:  Women should limit their alcohol intake to no more than 7 drinks/beers/glasses of wine (combined, not each!) per week. Moderation

## 2021-09-03 LAB — ESTRADIOL: Estradiol: 34 pg/mL

## 2021-09-03 LAB — CBC
HCT: 37.4 % (ref 35.0–45.0)
Hemoglobin: 12.4 g/dL (ref 11.7–15.5)
MCH: 31.9 pg (ref 27.0–33.0)
MCHC: 33.2 g/dL (ref 32.0–36.0)
MCV: 96.1 fL (ref 80.0–100.0)
MPV: 9.7 fL (ref 7.5–12.5)
Platelets: 325 10*3/uL (ref 140–400)
RBC: 3.89 10*6/uL (ref 3.80–5.10)
RDW: 12.5 % (ref 11.0–15.0)
WBC: 6.3 10*3/uL (ref 3.8–10.8)

## 2021-09-03 LAB — HEPATITIS C ANTIBODY
Hepatitis C Ab: NONREACTIVE
SIGNAL TO CUT-OFF: 0.08 (ref <1.00)

## 2021-09-03 LAB — HIV ANTIBODY (ROUTINE TESTING W REFLEX): HIV 1&2 Ab, 4th Generation: NONREACTIVE

## 2021-09-03 LAB — COMPREHENSIVE METABOLIC PANEL
AG Ratio: 1.3 (calc) (ref 1.0–2.5)
ALT: 16 U/L (ref 6–29)
AST: 15 U/L (ref 10–30)
Albumin: 4 g/dL (ref 3.6–5.1)
Alkaline phosphatase (APISO): 56 U/L (ref 31–125)
BUN: 7 mg/dL (ref 7–25)
CO2: 23 mmol/L (ref 20–32)
Calcium: 9.5 mg/dL (ref 8.6–10.2)
Chloride: 107 mmol/L (ref 98–110)
Creat: 0.67 mg/dL (ref 0.50–0.99)
Globulin: 3.2 g/dL (calc) (ref 1.9–3.7)
Glucose, Bld: 91 mg/dL (ref 65–99)
Potassium: 4.1 mmol/L (ref 3.5–5.3)
Sodium: 140 mmol/L (ref 135–146)
Total Bilirubin: 0.3 mg/dL (ref 0.2–1.2)
Total Protein: 7.2 g/dL (ref 6.1–8.1)

## 2021-09-03 LAB — LIPID PANEL
Cholesterol: 172 mg/dL (ref <200)
HDL: 52 mg/dL (ref >=50)
LDL Cholesterol (Calc): 91 mg/dL (calc)
Non-HDL Cholesterol (Calc): 120 mg/dL (calc) (ref <130)
Total CHOL/HDL Ratio: 3.3 (calc) (ref <5.0)
Triglycerides: 195 mg/dL — ABNORMAL HIGH (ref <150)

## 2021-09-03 LAB — RPR: RPR Ser Ql: NONREACTIVE

## 2021-09-03 LAB — CERVICOVAGINAL ANCILLARY ONLY
Chlamydia: NEGATIVE
Comment: NEGATIVE
Comment: NEGATIVE
Comment: NORMAL
Neisseria Gonorrhea: NEGATIVE
Trichomonas: NEGATIVE

## 2021-09-03 LAB — FOLLICLE STIMULATING HORMONE: FSH: 13 m[IU]/mL

## 2021-09-03 LAB — TSH: TSH: 1.08 mIU/L

## 2022-01-20 ENCOUNTER — Telehealth: Payer: Self-pay

## 2022-01-20 NOTE — Telephone Encounter (Signed)
Patient's last Depo-Provera injection was 07/08/2021.   She wants to start back on it as she is planning to become sexually active again.She has not had a period since discontinuing D-P inj.  AEX was 09/02/2021.  Patient advised that since it is late in the workday it may be tomorrow before I am able to call her back and she is fine with that.  What to advise?

## 2022-01-20 NOTE — Telephone Encounter (Signed)
Please schedule office visit with me due to her amenorrhea, lack of menstrual period.  We need to understand the cause for this and then determine if birth control is needed.

## 2022-01-21 NOTE — Telephone Encounter (Signed)
[name], Lindsey  Sisto Granillo R, RMA Left message to call and schedule  

## 2022-01-21 NOTE — Telephone Encounter (Signed)
Per DPR access note on file I left detailed message in voice mail recommending OV per Dr. Rica Records message. I told patient I will have the appointment desk give her a call to arrange the appointment. Left triage ph # om the event she has any questions.
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
## 2022-01-26 NOTE — Telephone Encounter (Signed)
Appt scheduled 8.21.23 at 11am.

## 2022-02-01 ENCOUNTER — Ambulatory Visit: Payer: Medicaid Other | Admitting: Obstetrics and Gynecology

## 2022-03-15 ENCOUNTER — Ambulatory Visit: Payer: Medicaid Other | Admitting: Obstetrics and Gynecology
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
## 2022-07-19 ENCOUNTER — Ambulatory Visit: Payer: Medicaid Other | Admitting: Obstetrics and Gynecology
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
## 2022-08-02 ENCOUNTER — Other Ambulatory Visit (HOSPITAL_COMMUNITY)
Admission: RE | Admit: 2022-08-02 | Discharge: 2022-08-02 | Disposition: A | Discharge status: Home / Self Care | Payer: Medicaid Other | Source: Ambulatory Visit | Attending: Obstetrics and Gynecology | Admitting: Obstetrics and Gynecology

## 2022-08-02 ENCOUNTER — Encounter: Payer: Self-pay | Admitting: Obstetrics and Gynecology

## 2022-08-02 ENCOUNTER — Ambulatory Visit (INDEPENDENT_AMBULATORY_CARE_PROVIDER_SITE_OTHER): Payer: Medicaid Other | Admitting: Obstetrics and Gynecology

## 2022-08-02 VITALS — BP 118/78 | HR 85 | Ht 67.7 in | Wt 178.0 lb

## 2022-08-02 DIAGNOSIS — N898 Other specified noninflammatory disorders of vagina: Secondary | ICD-10-CM | POA: Insufficient documentation

## 2022-08-02 DIAGNOSIS — F419 Anxiety disorder, unspecified: Secondary | ICD-10-CM | POA: Diagnosis not present

## 2022-08-02 DIAGNOSIS — N914 Secondary oligomenorrhea: Secondary | ICD-10-CM

## 2022-08-02 DIAGNOSIS — Z113 Encounter for screening for infections with a predominantly sexual mode of transmission: Secondary | ICD-10-CM

## 2022-08-02 DIAGNOSIS — F32A Depression, unspecified: Secondary | ICD-10-CM

## 2022-08-03 ENCOUNTER — Telehealth: Payer: Self-pay | Admitting: Obstetrics and Gynecology

## 2022-08-03 ENCOUNTER — Telehealth: Payer: Self-pay | Admitting: *Deleted

## 2022-08-03 DIAGNOSIS — F3289 Other specified depressive episodes: Secondary | ICD-10-CM

## 2022-08-03 DIAGNOSIS — F419 Anxiety disorder, unspecified: Secondary | ICD-10-CM

## 2022-08-03 LAB — PROLACTIN: Prolactin: 12.8 ng/mL

## 2022-08-03 LAB — FSH/LH
FSH: 8.9 m[IU]/mL
LH: 14.6 m[IU]/mL

## 2022-08-03 LAB — ESTRADIOL: Estradiol: 567 pg/mL — ABNORMAL HIGH

## 2022-08-03 LAB — TSH: TSH: 1.31 mIU/L

## 2022-08-03 LAB — HCG, QUANTITATIVE, PREGNANCY: HCG, Total, QN: <5 m[IU]/mL

## 2022-08-03 NOTE — Telephone Encounter (Signed)
Patient left voicemail on triage line requesting a return call. No details.   Returned call to patient. Patient states she was calling because she saw her results on MyChart and didn't know what the hcg levels meant. RN advised <5 usually indicates patient negative for pregnancy. RN advised Dr. Quincy Simmonds will review results and RN will return call with any additional recommendations. Patient agreeable and appreciative of phone call.   Routing to provider as Juluis Rainier.   Encounter closed.

## 2022-08-03 NOTE — Telephone Encounter (Signed)
Ambulatory referral placed to [facility].  Will route to [place] to schedule.

## 2022-08-03 NOTE — Telephone Encounter (Signed)
Please make a referral for my patient to see psychiatry at [facility].   She has anxiety and depression.

## 2022-08-04 ENCOUNTER — Other Ambulatory Visit: Payer: Self-pay | Admitting: Obstetrics and Gynecology

## 2022-08-04 DIAGNOSIS — R7989 Other specified abnormal findings of blood chemistry: Secondary | ICD-10-CM

## 2022-08-04 LAB — CERVICOVAGINAL ANCILLARY ONLY
Bacterial Vaginitis (gardnerella): POSITIVE — AB
Candida Glabrata: NEGATIVE
Candida Vaginitis: NEGATIVE
Chlamydia: NEGATIVE
Comment: NEGATIVE
Comment: NEGATIVE
Comment: NEGATIVE
Comment: NEGATIVE
Comment: NEGATIVE
Comment: NORMAL
Neisseria Gonorrhea: NEGATIVE
Trichomonas: NEGATIVE

## 2022-08-05 ENCOUNTER — Other Ambulatory Visit: Payer: Self-pay

## 2022-08-05 DIAGNOSIS — N760 Acute vaginitis: Secondary | ICD-10-CM

## 2022-08-05 MED ORDER — METRONIDAZOLE 500 MG PO TABS: 500.0000 mg | ORAL_TABLET | Freq: Two times a day (BID) | ORAL | 0 refills | Status: AC

## 2022-08-18 ENCOUNTER — Ambulatory Visit: Payer: Medicaid Other

## 2022-08-18 ENCOUNTER — Other Ambulatory Visit: Payer: Medicaid Other

## 2022-08-25 ENCOUNTER — Telehealth: Payer: Self-pay | Admitting: Obstetrics and Gynecology

## 2022-08-25 DIAGNOSIS — R7989 Other specified abnormal findings of blood chemistry: Secondary | ICD-10-CM

## 2022-08-25 NOTE — Telephone Encounter (Signed)
Please contact patient regarding her missed lab appointment to recheck her serum estrogen level.   It was abnormally elevated to a level of 567 on 08/02/22.   She has an appointment for an annual exam on 09/29/22.  She can have the blood work done at that time.  Please have her keep her appointment.   If the estrogen remains elevated, she needs additional evaluation of her ovaries to rule out abnormal ovarian cysts.

## 2022-08-25 NOTE — Telephone Encounter (Signed)
Thank you for the update!

## 2022-08-25 NOTE — Telephone Encounter (Signed)
Left voice mail message for patient to call.

## 2022-08-25 NOTE — Telephone Encounter (Signed)
Patient called. I read her Dr. Elza [name] message. She apologized that she completely forgot lab appt.  She did not want to wait until April appt. She scheduled lab appt for 10:00 on this Friday morning 08/27/22. New order placed because previous order had expired/past expected date.

## 2022-08-27 ENCOUNTER — Other Ambulatory Visit: Payer: Medicaid Other

## 2022-09-05 NOTE — Telephone Encounter (Signed)
Patient has not kept lab appointments to recheck her estradiol level.  Will plant to check this at her annual exam in April.   Please make a note in her chart to inform me if she fails to keep her annual exam appointment or if she moves the appointment date.  Please then close this encounter.

## 2022-09-06 NOTE — Telephone Encounter (Signed)
Note placed

## 2022-09-08 NOTE — Telephone Encounter (Signed)
Please provide update on referral to [facility]   Thank you!

## 2022-09-14 NOTE — Telephone Encounter (Signed)
Left voicemail with [facility] to return call.
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
## 2022-09-19 NOTE — Telephone Encounter (Signed)
Please close this referral and encounter.   I can review alternative options when she comes in for her annual exam this month.

## 2022-09-20 NOTE — Telephone Encounter (Signed)
Referral closed

## 2022-09-29 ENCOUNTER — Ambulatory Visit: Payer: Self-pay | Admitting: Obstetrics and Gynecology
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
## 2022-12-07 ENCOUNTER — Ambulatory Visit: Payer: Medicaid Other | Admitting: Obstetrics and Gynecology

## 2022-12-20 ENCOUNTER — Telehealth: Payer: Self-pay

## 2022-12-20 NOTE — Telephone Encounter (Signed)
BS pt. Was scheduled for AEX on 12/07/2022. However, cancelled d/t Dr. Rica Records unforseen absence. It was brought to my attention that Dr. Edgardo Roys are now being scheduled out until 04/2023.   DM says that pt scheduled her next AEX w/ you for 02/23/2023. However, pt would like to start back on her depo shots. Was given the go ahead back in 07/2022 by Dr. Edward Jolly but wanted her to have a repeat estradiol performed per lab notes. Please advise.

## 2022-12-20 NOTE — Telephone Encounter (Signed)
Ok to schedule depo appt and draw lab day of

## 2022-12-21 NOTE — Telephone Encounter (Signed)
Appt desk notified.

## 2022-12-23 ENCOUNTER — Other Ambulatory Visit: Payer: Medicaid Other

## 2022-12-23 ENCOUNTER — Ambulatory Visit (INDEPENDENT_AMBULATORY_CARE_PROVIDER_SITE_OTHER): Payer: Medicaid Other

## 2022-12-23 DIAGNOSIS — Z3042 Encounter for surveillance of injectable contraceptive: Secondary | ICD-10-CM | POA: Diagnosis not present

## 2022-12-23 DIAGNOSIS — R7989 Other specified abnormal findings of blood chemistry: Secondary | ICD-10-CM

## 2022-12-23 MED ORDER — MEDROXYPROGESTERONE ACETATE 150 MG/ML IM SUSY
150.0000 mg | PREFILLED_SYRINGE | Freq: Once | INTRAMUSCULAR | Status: AC
  Administered 2022-12-23: 150 mg via INTRAMUSCULAR

## 2022-12-23 NOTE — Addendum Note (Signed)
Addended by: Melrose Nakayama on: 12/23/2022 04:33 PM   Modules accepted: Orders

## 2022-12-29 LAB — FSH/LH
FSH: 60.1 m[IU]/mL
LH: 24.1 m[IU]/mL

## 2022-12-29 LAB — ESTRADIOL: Estradiol: <15 pg/mL

## 2023-01-05 NOTE — Telephone Encounter (Signed)
Pt received depo and had labs drawn on 12/23/2022. Will close encounter.

## 2023-02-23 ENCOUNTER — Other Ambulatory Visit (HOSPITAL_COMMUNITY)
Admission: RE | Admit: 2023-02-23 | Discharge: 2023-02-23 | Disposition: A | Discharge status: Home / Self Care | Payer: Medicaid Other | Source: Ambulatory Visit | Attending: Radiology | Admitting: Radiology

## 2023-02-23 ENCOUNTER — Ambulatory Visit (INDEPENDENT_AMBULATORY_CARE_PROVIDER_SITE_OTHER): Payer: Medicaid Other | Admitting: Radiology

## 2023-02-23 ENCOUNTER — Encounter: Payer: Self-pay | Admitting: Radiology

## 2023-02-23 VITALS — BP 132/88 | HR 84 | Ht 68.0 in | Wt 184.6 lb

## 2023-02-23 DIAGNOSIS — Z3042 Encounter for surveillance of injectable contraceptive: Secondary | ICD-10-CM

## 2023-02-23 DIAGNOSIS — Z01419 Encounter for gynecological examination (general) (routine) without abnormal findings: Secondary | ICD-10-CM | POA: Insufficient documentation

## 2023-02-23 NOTE — Progress Notes (Signed)
Lindsey [name] [birth year] [id number]   History:  42 y.o. G4P2 presents for annual exam. Requests pap today, last pap 2022 normal. Aware of guidelines. Overdue for screening mammogram. No gyn concerns. Happy with depo. Hx of elevated estrogen which normalized on its own. Would like to continue depo despite FSH fo 60.1  Gynecologic History Patient's last menstrual period was 12/23/2022 (exact date).   Contraception/Family planning: Depo-Provera injections Sexually active: yes Last Pap: 2022. Results were: normal Last mammogram: never  Obstetric History OB History  Gravida Para Term Preterm AB Living  4 2 2   2 2   SAB IAB Ectopic Multiple Live Births    2          # Outcome Date GA Lbr Len/2nd Weight Sex Type Anes PTL Lv  4 IAB           3 IAB           2 Term           1 Term              The following portions of the patient's history were reviewed and updated as appropriate: allergies, current medications, past family history, past medical history, past social history, past surgical history, and problem list.  Review of Systems Pertinent items noted in HPI and remainder of comprehensive ROS otherwise negative.   Past medical history, past surgical history, family history and social history were all reviewed and documented in the EPIC chart.   Exam:  Vitals:   02/23/23 1605 02/23/23 1612  BP: (!) 130/90 132/88  Pulse: 84   Weight: 184 lb 9.6 oz (83.7 kg)   Height: 5\' 8"  (1.727 m)    Body mass index is 28.07 kg/m.  General appearance:  Normal, overweight  Respiratory  Auscultation:  Clear without wheezing or rhonchi Cardiovascular  Auscultation:  Regular rate, without rubs, murmurs or gallops  Edema/varicosities:  Not grossly evident Abdominal  Soft,nontender, without masses, guarding or rebound.  Liver/spleen:  No organomegaly noted  Hernia:  None appreciated  Skin  Inspection:  Grossly normal Breasts: Examined lying and sitting.   Right: Without  masses, retractions, nipple discharge or axillary adenopathy.   Left: Without masses, retractions, nipple discharge or axillary adenopathy. Genitourinary   Inguinal/mons:  Normal without inguinal adenopathy  External genitalia:  Normal appearing vulva with no masses, tenderness, or lesions  BUS/Urethra/Skene's glands:  Normal without masses or exudate  Vagina:  Normal appearing with normal color and discharge, no lesions  Cervix:  Normal appearing without discharge or lesions  Uterus:  Normal in size, shape and contour.  Mobile, nontender  Adnexa/parametria:     Rt: Normal in size, without masses or tenderness.   Lt: Normal in size, without masses or tenderness.  Anus and perineum: Normal   Cinda Quest, RN present for exam  Assessment/Plan:   1. Well woman exam with routine gynecological exam - Cytology - PAP( [facility]) - Declines need for STI screen - Schedule mammogram, information for the breast center offered  2. Encounter for surveillance of injectable contraceptive Continue depo No further menopausal symptoms, except an occasional night sweats No further testing needed at this time, will continue depo as planned as that is patient preference. Call if symptoms occur.     Discussed SBE, pap and mammogram screening as directed/appropriate. Recommend of exercise weekly, including weight bearing exercise. Encouraged the use of seatbelts and sunscreen. Return in 1 year for annual or as needed.  Arlie Solomons B WHNP-BC 4:25 PM 02/23/2023

## 2023-03-01 LAB — CYTOLOGY - PAP
Adequacy: ABSENT
Comment: NEGATIVE
Comment: NEGATIVE
Comment: NEGATIVE
Diagnosis: UNDETERMINED — AB
HPV 16: NEGATIVE
HPV 18 / 45: NEGATIVE
High risk HPV: POSITIVE — AB

## 2023-03-07 ENCOUNTER — Other Ambulatory Visit: Payer: Self-pay | Admitting: Obstetrics and Gynecology

## 2023-03-07 ENCOUNTER — Other Ambulatory Visit: Payer: Self-pay

## 2023-03-07 DIAGNOSIS — R87610 Atypical squamous cells of undetermined significance on cytologic smear of cervix (ASC-US): Secondary | ICD-10-CM

## 2023-03-07 MED ORDER — FLUCONAZOLE 150 MG PO TABS: 150.0000 mg | ORAL_TABLET | Freq: Once | ORAL | 0 refills | Status: AC

## 2023-04-05 ENCOUNTER — Telehealth: Payer: Self-pay | Admitting: *Deleted

## 2023-04-05 NOTE — Telephone Encounter (Signed)
Patient left message on triage line on 04/04/23, requesting Rx for yeast. Was treated recently, symptoms returned.   Left message to call [facility] Triage at [phone], option 4.   Per review of EPIC, pap 02/23/23 positive for yeast, tx with diflucan.

## 2023-04-07 ENCOUNTER — Encounter: Payer: Medicaid Other | Admitting: Obstetrics and Gynecology
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
## 2023-04-19 NOTE — Telephone Encounter (Signed)
Pt was scheduled for colpo w/ BS on 04/07/2023-it was recommended by BS to send letter recommending colpo.

## 2023-04-25 ENCOUNTER — Encounter: Payer: Self-pay | Admitting: Obstetrics and Gynecology

## 2023-04-26 ENCOUNTER — Encounter: Payer: Self-pay | Admitting: *Deleted

## 2023-04-26 NOTE — Telephone Encounter (Signed)
See results dated 02/23/23.   Letter mailed to patient.   Encounter closed.

## 2023-05-02 ENCOUNTER — Telehealth: Payer: Self-pay | Admitting: Obstetrics and Gynecology

## 2023-05-02 NOTE — Telephone Encounter (Signed)
Patient no show to her October 24 colposcopy appointment. Two messages and letter sent to patient to call and reschedule appointment; patient has not called back.

## 2023-05-02 NOTE — Telephone Encounter (Signed)
See results dated 02/23/23.   Letter mailed.   Order cancelled.   Encounter closed.

## 2023-06-03 ENCOUNTER — Ambulatory Visit: Payer: Medicaid Other

## 2023-06-29 ENCOUNTER — Encounter: Payer: Medicaid Other | Admitting: Obstetrics and Gynecology

## 2023-07-18 ENCOUNTER — Encounter: Payer: Medicaid Other | Admitting: Obstetrics and Gynecology

## 2023-07-26 ENCOUNTER — Telehealth: Payer: Self-pay | Admitting: Obstetrics and Gynecology

## 2023-07-26 NOTE — Telephone Encounter (Signed)
Patient has not show twice and cancelled once her colposcopy appointment. Please advise.

## 2023-08-05 NOTE — Telephone Encounter (Signed)
Dr. Edward Jolly -please review and advise on f/u. PAP was 02/23/23.

## 2023-08-09 NOTE — Telephone Encounter (Signed)
 Letter mailed to patient on 05/02/23 recommending colposcopy.  The letter title in [facility] 04/26/23 states colonoscopy and not colposcopy.   Colposcopy is still recommended.  Please reach out to Risk Management for recommendations about how to proceed with communication and care for the patient.

## 2023-08-11 NOTE — Telephone Encounter (Signed)
 Staff message to Dr. Edward Jolly.

## 2023-08-11 NOTE — Telephone Encounter (Signed)
 Reviewed with Dr. Edward Jolly.   Call placed to patient, left detailed message, ok per dpr. Advised f/u with recommendations for further evaluation of abnormal pap smear with colposcopy, please return call to me directly at [phone], option 5, so that we can further discuss any barriers to scheduling recommended evaluation.

## 2023-09-13 NOTE — Telephone Encounter (Signed)
 No response from patient.   No future appts scheduled.   Letter with recommendations sent on 04/26/23.   Dr. Edward Jolly -ok to close encounter?

## 2023-09-13 NOTE — Telephone Encounter (Signed)
 Encounter closed.   Patient has received recommendation for colposcopy.

## 2023-12-06 ENCOUNTER — Telehealth: Payer: Self-pay | Admitting: *Deleted

## 2023-12-06 DIAGNOSIS — R87610 Atypical squamous cells of undetermined significance on cytologic smear of cervix (ASC-US): Secondary | ICD-10-CM

## 2023-12-06 NOTE — Telephone Encounter (Signed)
-----   Message from [place] S sent at 12/06/2023 10:53 AM EDT ----- Regarding: colpo Dr Nikki has ordered a colposcopy back in September patient was out of town but now wants to come in for that procedure. Please ask Dr Nikki if we schedule colpo or just office visit at this time. If colpo please enter new order. Thanks.

## 2023-12-06 NOTE — Telephone Encounter (Signed)
 Letter was mailed to patient 04/25/2023 with colposcopy recommendations.   Dr. Nikki -please review and advise on plan of care.

## 2023-12-06 NOTE — Telephone Encounter (Signed)
 I recommend an office visit with pap, HR HPV testing, and colposcopy all at the same visit.

## 2023-12-06 NOTE — Telephone Encounter (Signed)
 Call placed to patient, left detailed message, ok per dpr. Return call to office at [phone], opt 1 for appts, opt 4 if any additional questions.   Order for colpo placed

## 2023-12-12 NOTE — Telephone Encounter (Signed)
 Per review of EPIC, colpo scheduled for 01/10/24.   Routing FYI.   Encounter closed.

## 2024-01-10 ENCOUNTER — Encounter: Admitting: Obstetrics and Gynecology
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
## 2024-01-12 ENCOUNTER — Telehealth: Payer: Self-pay | Admitting: *Deleted

## 2024-01-12 NOTE — Telephone Encounter (Signed)
 Patient left message on triage line requesting to proceed with depo provera  injection prior to next appt.

## 2024-01-13 NOTE — Telephone Encounter (Signed)
 Will await response from Dr. Nikki.  Last AEX 02/23/23 SLOAN

## 2024-01-13 NOTE — Telephone Encounter (Signed)
 Consulted with Dr Nikki about this patient. She has been seeing her since 2020. I will defer to her regarding depo restart. Since colpo is scheduled with her and in trying to keep patients with their original/primary providers as much as possible I would reschedule her AEX with Dr Nikki unless patient says she wants to see me specifically.

## 2024-01-13 NOTE — Telephone Encounter (Signed)
 Spoke with patient. Patient is requesting to restart depo-provera , last injection received 02/23/23. Using condoms for contraceptive.   Overdue for colpo, letter was mailed 04/26/23, scheduled for 03/07/24 with Dr. Nikki.  Per telephone encounter dated 12/06/23, pap and colpo at the same time.   AEX scheduled for 03/20/24 with JC.    Advised I will need to review plan of care with provider and f/u to advise on restart of depo.    Jami -please review and advise on restart of depo?

## 2024-01-16 NOTE — Telephone Encounter (Signed)
 Spoke with patient, advised per Dr. Nikki.   AEX canceled with JC.   Will keep appt as scheduled for PCP, colpo and depo. Added to waitlist.   Patient verbalizes understanding and is agreeable.   Encounter closed.

## 2024-01-16 NOTE — Telephone Encounter (Signed)
 I recommend office visit for pap, colposcopy, and discussion of depo provera .   We can try move up her colposcopy appointment.   She has had several missed appointments for her colposcopy and for depo injections.
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
## 2024-03-07 ENCOUNTER — Encounter: Payer: Self-pay | Admitting: Obstetrics and Gynecology

## 2024-03-07 ENCOUNTER — Ambulatory Visit (INDEPENDENT_AMBULATORY_CARE_PROVIDER_SITE_OTHER): Admitting: Obstetrics and Gynecology

## 2024-03-07 ENCOUNTER — Other Ambulatory Visit (HOSPITAL_COMMUNITY)
Admission: RE | Admit: 2024-03-07 | Discharge: 2024-03-07 | Disposition: A | Discharge status: Home / Self Care | Source: Ambulatory Visit | Attending: Obstetrics and Gynecology | Admitting: Obstetrics and Gynecology

## 2024-03-07 VITALS — BP 126/84 | HR 96

## 2024-03-07 DIAGNOSIS — Z113 Encounter for screening for infections with a predominantly sexual mode of transmission: Secondary | ICD-10-CM | POA: Diagnosis present

## 2024-03-07 DIAGNOSIS — R87810 Cervical high risk human papillomavirus (HPV) DNA test positive: Secondary | ICD-10-CM | POA: Diagnosis not present

## 2024-03-07 DIAGNOSIS — R87610 Atypical squamous cells of undetermined significance on cytologic smear of cervix (ASC-US): Secondary | ICD-10-CM | POA: Insufficient documentation

## 2024-03-07 DIAGNOSIS — Z01812 Encounter for preprocedural laboratory examination: Secondary | ICD-10-CM

## 2024-03-07 LAB — PREGNANCY, URINE: Preg Test, Ur: NEGATIVE

## 2024-03-07 NOTE — Patient Instructions (Signed)
 Colposcopy, Care After  The following information offers guidance on how to care for yourself after your procedure. Your doctor may also give you more specific instructions. If you have problems or questions, contact your doctor. What can I expect after the procedure? If you did not have a sample of your tissue taken out (did not have a biopsy), you may only have some spotting of blood for a few days. You can go back to your normal activities. If you had a sample of your tissue taken out, it is common to have: Soreness and mild pain. These may last for a few days. Mild bleeding or fluid (discharge) coming from your vagina. The fluid will look dark and grainy. You may have this for a few days. The fluid may be caused by a liquid that was used during your procedure. You may need to wear a sanitary pad. Spotting of blood for at least 48 hours after the procedure. Follow these instructions at home: Medicines Take over-the-counter and prescription medicines only as told by your doctor. Ask your doctor what over-the-counter pain medicines and prescription medicines you can start taking again. This is very important if you take blood thinners. Activity For at least 3 days, or for as long as told by your doctor, avoid: Douching. Using tampons. Having sex. Return to your normal activities as told by your doctor. Ask your doctor what activities are safe for you. General instructions Ask your doctor if you may take baths, swim, or use a hot tub. You may take showers. If you use birth control (contraception), keep using it. Keep all follow-up visits. Contact a doctor if: You have a fever or chills. You faint or feel light-headed. Get help right away if: You bleed a lot from your vagina. A lot of bleeding means that the bleeding soaks through a pad in less than 1 hour. You have clumps of blood (blood clots) coming from your vagina. You have signs that could mean you have an infection. This may be  fluid coming from your vagina that is: Different than normal. Yellow. Bad-smelling. You have very bad pain or cramps in your lower belly that do not get better with medicine. Summary If you did not have a sample of your tissue taken out, you may only have some spotting of blood for a few days. You can go back to your normal activities. If you had a sample of your tissue taken out, it is common to have mild pain for a few days and spotting for 48 hours. Avoid douching, using tampons, and having sex for at least 3 days after the procedure or for as long as told. Get help right away if you have a lot of bleeding, very bad pain, or signs of infection. This information is not intended to replace advice given to you by your health care provider. Make sure you discuss any questions you have with your health care provider. Document Revised: 10/26/2020 Document Reviewed: 10/26/2020 Elsevier Patient Education  2024 [employer].

## 2024-03-12 ENCOUNTER — Ambulatory Visit: Payer: Self-pay | Admitting: Obstetrics and Gynecology

## 2024-03-12 LAB — CYTOLOGY - PAP
Chlamydia: NEGATIVE
Comment: NEGATIVE
Comment: NEGATIVE
Comment: NEGATIVE
Comment: NORMAL
Diagnosis: NEGATIVE
High risk HPV: NEGATIVE
Neisseria Gonorrhea: NEGATIVE
Trichomonas: NEGATIVE

## 2024-03-12 LAB — SURGICAL PATHOLOGY

## 2024-03-19 ENCOUNTER — Other Ambulatory Visit: Payer: Self-pay | Admitting: Obstetrics and Gynecology

## 2024-03-19 MED ORDER — FLUCONAZOLE 150 MG PO TABS: ORAL_TABLET | ORAL | 0 refills | Status: AC

## 2024-03-19 NOTE — Telephone Encounter (Signed)
 Your pap is normal, and your high risk HPV test is negative.  The pap did show some signs of yeast.  This does not need to be treated unless you are having symptoms.  It can be treated with over the counter Monistat 1, 3 or 7 day treatment.    Patient called requesting an rx for diflucan . Please advise.

## 2024-03-20 ENCOUNTER — Ambulatory Visit: Admitting: Radiology

## 2024-04-12 ENCOUNTER — Ambulatory Visit: Admitting: Radiology

## 2024-07-04 ENCOUNTER — Ambulatory Visit: Admitting: Radiology

## 2024-08-30 ENCOUNTER — Ambulatory Visit: Admitting: Radiology
